# Patient Record
Sex: Female | Born: 1965 | Race: Asian | Hispanic: No | Marital: Single | Smoking: Never smoker
Health system: Southern US, Community
[De-identification: ages and names within clinical notes are randomized; demographics above are authoritative.]

## PROBLEM LIST (undated history)

## (undated) DIAGNOSIS — I214 Non-ST elevation (NSTEMI) myocardial infarction: Secondary | ICD-10-CM

## (undated) DIAGNOSIS — D649 Anemia, unspecified: Secondary | ICD-10-CM

## (undated) DIAGNOSIS — I10 Essential (primary) hypertension: Secondary | ICD-10-CM

## (undated) HISTORY — DX: Anemia, unspecified: D64.9

## (undated) HISTORY — DX: Non-ST elevation (NSTEMI) myocardial infarction: I21.4

## (undated) HISTORY — DX: Hypocalcemia: E83.51

---

## 2018-04-18 ENCOUNTER — Emergency Department (HOSPITAL_COMMUNITY): Payer: Medicaid Other

## 2018-04-18 ENCOUNTER — Encounter (HOSPITAL_COMMUNITY): Payer: Self-pay | Admitting: Emergency Medicine

## 2018-04-18 ENCOUNTER — Inpatient Hospital Stay (HOSPITAL_COMMUNITY)
Admission: EM | Admit: 2018-04-18 | Discharge: 2018-04-29 | DRG: 233 | Disposition: A | Discharge status: Home / Self Care | Payer: Medicaid Other | Attending: Cardiothoracic Surgery | Admitting: Cardiothoracic Surgery

## 2018-04-18 DIAGNOSIS — I252 Old myocardial infarction: Secondary | ICD-10-CM

## 2018-04-18 DIAGNOSIS — Z951 Presence of aortocoronary bypass graft: Secondary | ICD-10-CM

## 2018-04-18 DIAGNOSIS — F17220 Nicotine dependence, chewing tobacco, uncomplicated: Secondary | ICD-10-CM | POA: Diagnosis present

## 2018-04-18 DIAGNOSIS — Z8249 Family history of ischemic heart disease and other diseases of the circulatory system: Secondary | ICD-10-CM | POA: Diagnosis not present

## 2018-04-18 DIAGNOSIS — E669 Obesity, unspecified: Secondary | ICD-10-CM | POA: Diagnosis present

## 2018-04-18 DIAGNOSIS — Z91018 Allergy to other foods: Secondary | ICD-10-CM | POA: Diagnosis not present

## 2018-04-18 DIAGNOSIS — I959 Hypotension, unspecified: Secondary | ICD-10-CM | POA: Diagnosis present

## 2018-04-18 DIAGNOSIS — I2584 Coronary atherosclerosis due to calcified coronary lesion: Secondary | ICD-10-CM | POA: Diagnosis present

## 2018-04-18 DIAGNOSIS — D509 Iron deficiency anemia, unspecified: Secondary | ICD-10-CM | POA: Diagnosis present

## 2018-04-18 DIAGNOSIS — I214 Non-ST elevation (NSTEMI) myocardial infarction: Secondary | ICD-10-CM | POA: Diagnosis present

## 2018-04-18 DIAGNOSIS — I110 Hypertensive heart disease with heart failure: Secondary | ICD-10-CM | POA: Diagnosis not present

## 2018-04-18 DIAGNOSIS — R011 Cardiac murmur, unspecified: Secondary | ICD-10-CM | POA: Diagnosis present

## 2018-04-18 DIAGNOSIS — R7303 Prediabetes: Secondary | ICD-10-CM | POA: Diagnosis present

## 2018-04-18 DIAGNOSIS — I5021 Acute systolic (congestive) heart failure: Secondary | ICD-10-CM | POA: Diagnosis not present

## 2018-04-18 DIAGNOSIS — I2510 Atherosclerotic heart disease of native coronary artery without angina pectoris: Secondary | ICD-10-CM | POA: Diagnosis present

## 2018-04-18 DIAGNOSIS — I6522 Occlusion and stenosis of left carotid artery: Secondary | ICD-10-CM | POA: Diagnosis present

## 2018-04-18 DIAGNOSIS — X58XXXA Exposure to other specified factors, initial encounter: Secondary | ICD-10-CM | POA: Diagnosis present

## 2018-04-18 DIAGNOSIS — E785 Hyperlipidemia, unspecified: Secondary | ICD-10-CM | POA: Diagnosis present

## 2018-04-18 DIAGNOSIS — J9811 Atelectasis: Secondary | ICD-10-CM | POA: Diagnosis not present

## 2018-04-18 DIAGNOSIS — I509 Heart failure, unspecified: Secondary | ICD-10-CM | POA: Diagnosis present

## 2018-04-18 DIAGNOSIS — Z6838 Body mass index (BMI) 38.0-38.9, adult: Secondary | ICD-10-CM

## 2018-04-18 DIAGNOSIS — D62 Acute posthemorrhagic anemia: Secondary | ICD-10-CM | POA: Diagnosis not present

## 2018-04-18 DIAGNOSIS — D649 Anemia, unspecified: Secondary | ICD-10-CM

## 2018-04-18 DIAGNOSIS — M4854XA Collapsed vertebra, not elsewhere classified, thoracic region, initial encounter for fracture: Secondary | ICD-10-CM | POA: Diagnosis present

## 2018-04-18 DIAGNOSIS — Z7982 Long term (current) use of aspirin: Secondary | ICD-10-CM

## 2018-04-18 DIAGNOSIS — N920 Excessive and frequent menstruation with regular cycle: Secondary | ICD-10-CM | POA: Diagnosis present

## 2018-04-18 DIAGNOSIS — Z09 Encounter for follow-up examination after completed treatment for conditions other than malignant neoplasm: Secondary | ICD-10-CM

## 2018-04-18 DIAGNOSIS — Z79899 Other long term (current) drug therapy: Secondary | ICD-10-CM | POA: Diagnosis not present

## 2018-04-18 HISTORY — DX: Essential (primary) hypertension: I10

## 2018-04-18 LAB — BASIC METABOLIC PANEL
Anion gap: 9 (ref 5–15)
BUN: 10 mg/dL (ref 6–20)
CO2: 23 mmol/L (ref 22–32)
Calcium: 8.6 mg/dL — ABNORMAL LOW (ref 8.9–10.3)
Chloride: 102 mmol/L (ref 98–111)
Creatinine, Ser: 0.83 mg/dL (ref 0.44–1.00)
GFR calc Af Amer: >60 mL/min (ref >60)
GFR calc non Af Amer: >60 mL/min (ref >60)
Glucose, Bld: 123 mg/dL — ABNORMAL HIGH (ref 70–99)
Potassium: 3.5 mmol/L (ref 3.5–5.1)
Sodium: 134 mmol/L — ABNORMAL LOW (ref 135–145)

## 2018-04-18 LAB — RETICULOCYTES
Immature Retic Fract: 19.9 % — ABNORMAL HIGH (ref 2.3–15.9)
RBC.: 3.26 MIL/uL — ABNORMAL LOW (ref 3.87–5.11)
Retic Count, Absolute: 38.5 10*3/uL (ref 19.0–186.0)
Retic Ct Pct: 1.2 % (ref 0.4–3.1)

## 2018-04-18 LAB — CBC
HCT: 30.5 % — ABNORMAL LOW (ref 36.0–46.0)
Hemoglobin: 8 g/dL — ABNORMAL LOW (ref 12.0–15.0)
MCH: 22.2 pg — ABNORMAL LOW (ref 26.0–34.0)
MCHC: 26.2 g/dL — ABNORMAL LOW (ref 30.0–36.0)
MCV: 84.7 fL (ref 80.0–100.0)
Platelets: 306 10*3/uL (ref 150–400)
RBC: 3.6 MIL/uL — ABNORMAL LOW (ref 3.87–5.11)
RDW: 15 % (ref 11.5–15.5)
WBC: 5.1 10*3/uL (ref 4.0–10.5)
nRBC: 0 % (ref 0.0–0.2)

## 2018-04-18 LAB — I-STAT TROPONIN, ED: Troponin i, poc: 0.16 ng/mL (ref 0.00–0.08)

## 2018-04-18 LAB — BRAIN NATRIURETIC PEPTIDE: B Natriuretic Peptide: 287.6 pg/mL — ABNORMAL HIGH (ref 0.0–100.0)

## 2018-04-18 LAB — I-STAT BETA HCG BLOOD, ED (MC, WL, AP ONLY): I-stat hCG, quantitative: <5 m[IU]/mL (ref <5)

## 2018-04-18 LAB — HEMOGLOBIN A1C
HEMOGLOBIN A1C: 5.9 % — AB (ref 4.8–5.6)
MEAN PLASMA GLUCOSE: 122.63 mg/dL

## 2018-04-18 MED ORDER — ASPIRIN 81 MG PO CHEW
81.0000 mg | CHEWABLE_TABLET | Freq: Every day | ORAL | Status: DC
  Administered 2018-04-19 – 2018-04-20 (×2): 81 mg via ORAL
  Filled 2018-04-18 (×2): qty 1

## 2018-04-18 MED ORDER — SODIUM CHLORIDE 0.9 % IV BOLUS
1000.0000 mL | Freq: Once | INTRAVENOUS | Status: AC
  Administered 2018-04-18: 1000 mL via INTRAVENOUS

## 2018-04-18 MED ORDER — NITROGLYCERIN 0.4 MG SL SUBL
0.4000 mg | SUBLINGUAL_TABLET | SUBLINGUAL | Status: DC | PRN
  Administered 2018-04-22 – 2018-04-23 (×2): 0.4 mg via SUBLINGUAL
  Filled 2018-04-18 (×6): qty 1

## 2018-04-18 MED ORDER — ACETAMINOPHEN 325 MG PO TABS
650.0000 mg | ORAL_TABLET | ORAL | Status: DC | PRN
  Administered 2018-04-20 – 2018-04-21 (×2): 650 mg via ORAL
  Filled 2018-04-18 (×3): qty 2

## 2018-04-18 MED ORDER — HEPARIN (PORCINE) IN NACL 100-0.45 UNIT/ML-% IJ SOLN
850.0000 [IU]/h | INTRAMUSCULAR | Status: DC
  Administered 2018-04-18: 600 [IU]/h via INTRAVENOUS
  Administered 2018-04-19 – 2018-04-21 (×2): 850 [IU]/h via INTRAVENOUS
  Filled 2018-04-18 (×3): qty 250

## 2018-04-18 MED ORDER — HEPARIN BOLUS VIA INFUSION
3000.0000 [IU] | Freq: Once | INTRAVENOUS | Status: AC
  Administered 2018-04-18: 3000 [IU] via INTRAVENOUS
  Filled 2018-04-18: qty 3000

## 2018-04-18 MED ORDER — ATORVASTATIN CALCIUM 40 MG PO TABS
40.0000 mg | ORAL_TABLET | Freq: Every day | ORAL | Status: DC
  Administered 2018-04-19 – 2018-04-28 (×9): 40 mg via ORAL
  Filled 2018-04-18 (×9): qty 1

## 2018-04-18 MED ORDER — ASPIRIN 81 MG PO CHEW: 324.0000 mg | CHEWABLE_TABLET | ORAL | Status: AC

## 2018-04-18 MED ORDER — ASPIRIN 300 MG RE SUPP: 300.0000 mg | RECTAL | Status: AC

## 2018-04-18 MED ORDER — MORPHINE SULFATE (PF) 4 MG/ML IV SOLN
4.0000 mg | Freq: Once | INTRAVENOUS | Status: AC
  Administered 2018-04-18: 4 mg via INTRAVENOUS
  Filled 2018-04-18: qty 1

## 2018-04-18 NOTE — H&P (Addendum)
Cardiology Admission History and Physical:   Patient ID: Abeeha Twist MRN: [id number]; DOB: 1965-08-13   Admission date: 04/18/2018  Primary Care Provider: No primary care provider on file. Primary Cardiologist: No primary care provider on file.    Chief Complaint:  Chest pain  Patient Profile:   [name] is a 52 y.o. female with a history of HF, HTN and anemia who presents to the ED with chest pain.   History of Present Illness:   [name] is a 52 y.o. female with a history of HF, HTN and anemia who presents to the ED with chest pain.   The patient has relocated to [facility] from [place]. She reports a history of HF and HTN for which she takes carvedilol and lisinopril and aspirin; she was previously on other medications but stopped taking them due to cost. She has had prior echocardiograms and believes that she has had prior stress tests (results unknown), but she does not think she has had prior cardiac catheterizations.  She presented to the ED today with chest pain has occurred intermittently over the past two weeks. She describes the pain to be a sharp sensation that comes without specific activity. This afternoon she ate dinner and had persistent symptoms for three hours. She denies chest pain before two weeks ago and denies associated dyspnea or edema or any other symptoms. Given her ongoing pain today, she came to the ED.  In the ED, pt with SBP 90-100s and HR in the 70s. ECG showed sinus rhythm with LVH and ST depressions and TWI in inferolateral leads. Labs notable for troponin 0.16, Hgb 8.0 (baseline unknown). She was given NTG and morphine with improvement of her pain. She was given 1L NS bolus for low BP. Cardiology was called for further recommendations.   On my evaluation, the patient reports resolution of her chest pain, although she does report a minimal heavy sensation. She states that she has had low blood counts in the past requiring blood transfusions. She is unaware  of the cause of her anemia. She states that she is at the end of her menstrual cycle, which was very heavy.   Past Medical History:  Diagnosis Date  . Hypertension     Past Surgical History:  Procedure Laterality Date  . CESAREAN SECTION       Medications Prior to Admission: Prior to Admission medications   Not on File     Allergies:   Not on File  Social History:   Social History   Socioeconomic History  . Marital status: Married    Spouse name: Not on file  . Number of children: Not on file  . Years of education: Not on file  . Highest education level: Not on file  Occupational History  . Not on file  Social Needs  . Financial resource strain: Not on file  . Food insecurity:    Worry: Not on file    Inability: Not on file  . Transportation needs:    Medical: Not on file    Non-medical: Not on file  Tobacco Use  . Smoking status: Never Smoker  . Smokeless tobacco: Current User    Types: Chew  Substance and Sexual Activity  . Alcohol use: Not Currently    Frequency: Never  . Drug use: Not on file  . Sexual activity: Not on file  Lifestyle  . Physical activity:    Days per week: Not on file    Minutes per session: Not on file  .  Stress: Not on file  Relationships  . Social connections:    Talks on phone: Not on file    Gets together: Not on file    Attends religious service: Not on file    Active member of club or organization: Not on file    Attends meetings of clubs or organizations: Not on file    Relationship status: Not on file  . Intimate partner violence:    Fear of current or ex partner: Not on file    Emotionally abused: Not on file    Physically abused: Not on file    Forced sexual activity: Not on file  Other Topics Concern  . Not on file  Social History Narrative  . Not on file    Family History:   The patient's family history includes Hypertension in her father.    ROS:  Please see the history of present illness.  All other ROS  reviewed and negative.     Physical Exam/Data:   Vitals:   04/18/18 1822 04/18/18 1937 04/18/18 2000  BP: (!) 103/58 (!) 95/55   Pulse: 72 71   Resp:  16   Temp: 98.5 F (36.9 C) 98.4 F (36.9 C)   TempSrc: Oral Oral   SpO2: 100% 100%   Weight:   72.6 kg  Height:   4\' 6"  (1.372 m)   No intake or output data in the 24 hours ending 04/18/18 2119 Filed Weights   04/18/18 2000  Weight: 72.6 kg   Body mass index is 38.58 kg/m.  General:  Well nourished, well developed, in no acute distress HEENT: normal Lymph: no adenopathy Neck: JVD 3 cm above clavicle at 45 degrees. +HSM Endocrine:  No thryomegaly Cardiac:  normal S1, S2; RRR; II/VI systolic murmur Lungs:  clear to auscultation bilaterally, no wheezing, rhonchi or rales  Abd: soft, nontender  Ext: no LE edema Musculoskeletal:  No deformities, BUE and BLE strength normal and equal Skin: warm and dry  Neuro:  CNs 2-12 intact, no focal abnormalities noted Psych:  Normal affect    EKG:  The ECG that was done and was personally reviewed and is described above in HPI  Relevant CV Studies: None in system   Laboratory Data:  Chemistry Recent Labs  Lab 04/18/18 1839  NA 134*  K 3.5  CL 102  CO2 23  GLUCOSE 123*  BUN 10  CREATININE 0.83  CALCIUM 8.6*  GFRNONAA >60  GFRAA >60  ANIONGAP 9    No results for input(s): PROT, ALBUMIN, AST, ALT, ALKPHOS, BILITOT in the last 168 hours. Hematology Recent Labs  Lab 04/18/18 1839  WBC 5.1  RBC 3.60*  HGB 8.0*  HCT 30.5*  MCV 84.7  MCH 22.2*  MCHC 26.2*  RDW 15.0  PLT 306   Cardiac EnzymesNo results for input(s): TROPONINI in the last 168 hours.  Recent Labs  Lab 04/18/18 1850  TROPIPOC 0.16*    BNPNo results for input(s): BNP, PROBNP in the last 168 hours.  DDimer No results for input(s): DDIMER in the last 168 hours.  Radiology/Studies:  Dg Chest 2 View  Result Date: 04/18/2018 CLINICAL DATA:  Pt states having medial chest pains for 2 weeks got  severe today with SOB> Hx of HTN EXAM: CHEST - 2 VIEW COMPARISON:  None. FINDINGS: Heart size is normal. The lungs are free of focal consolidations and pleural effusions. No pulmonary edema. There is mild anterior wedge deformity of midthoracic vertebral bodies. IMPRESSION: 1.  No evidence for  acute cardiopulmonary abnormality. 2. Midthoracic wedge compression fractures are probably chronic and warrant further evaluation. Recommend DXA scan if not performed recently. Electronically Signed   By: Norva Pavlov M.D.   On: 04/18/2018 19:04    Assessment and Plan:   Chest pain Suspected NSTEMI The patient presents to the ED with two weeks of intermittent chest pain that was persistent today. ECG shows LVH and repolarization changes that may be consistent with ischemia. Troponin is mildly elevated on initial measurement. She has a reported history of unspecified HF and HTN; her ischemic workup history is unclear. Her described symptoms are atypical, but her clinical presentation is nevertheless concerning for ACS. She may also be experiencing a contribution of symptomatic anemia. She is currently chest pain free after NTG and morphine. At this time, will plan for empiric treatment for ACS. Will need caution with antithrombotic therapy in the setting of concomitant anemia.  -Heparin infusion ordered.  -Continue ASA -Atorvastatin ordered -NTG PRN pain -Echocardiogram ordered -Continue to trend troponin -Holding home BB until BP stability established -HgA1c, lipid panel ordered -NPO at midnight in anticipation of possible LHC in AM  Anemia Hgb 8.0 in the ED; MCV consistent with normocytic anemia. She reports a history of anemia of unclear etiology for which she has had transfusions. She is now finishing a heavy menstrual cycle, which may be the cause of her anemia. As above, this anemia may be contributing to presenting symptoms. -Continue to trend.  -Type and screen ordered. Will transfuse if Hgb  drops <8 given suspected ACS -Iron panel, reticulocytes added on to labs. Can work up further as needed.  HF The patient reports a history of HF with details that are unclear. She is on medications that suggest reduced EF. She has not had any symptoms of HF and only complains of recent chest pain. She has mild JVP elevation but does not have signs of significant hypervolemia. -Echocardiogram ordered -BNP ordered -Holding home carvedilolol, lisinopril in the setting of relative hypotension  -Hold further fluids for now. May need diuresis if she undergoes transfusion.   Systolic murmur Noted on exam. Sounds to be related to AoV outflow. Unclear if this may be contributing to symptoms. -Echocardiogram ordered  HTN BP relatively low in the ED for unclear reasons. She was given IVF in the ED -Holding BP modifying medications for now -Holding IVF given HF and mild hypervolemia  Wedge compression Noted on ED CXR -Will need DEXA  Severity of Illness: The appropriate patient status for this patient is INPATIENT. Inpatient status is judged to be reasonable and necessary in order to provide the required intensity of service to ensure the patient's safety. The patient's presenting symptoms, physical exam findings, and initial radiographic and laboratory data in the context of their chronic comorbidities is felt to place them at high risk for further clinical deterioration. Furthermore, it is not anticipated that the patient will be medically stable for discharge from the hospital within 2 midnights of admission. The following factors support the patient status of inpatient.   " The patient's presenting symptoms include chest pain. " The worrisome physical exam findings include elevated JVP. " The initial radiographic and laboratory data are worrisome because of elevated troponin. " The chronic co-morbidities include HF, HTN, anemia.   * I certify that at the point of admission it is my clinical  judgment that the patient will require inpatient hospital care spanning beyond 2 midnights from the point of admission due to high intensity of service,  high risk for further deterioration and high frequency of surveillance required.*    For questions or updates, please contact [facility] Please consult [email or web address] for contact info under        Signed, Ernest Mallick, MD  04/18/2018 9:19 PM

## 2018-04-18 NOTE — Progress Notes (Signed)
ANTICOAGULATION CONSULT NOTE - Initial Consult  Pharmacy Consult for heparin Indication: chest pain/ACS  Not on File  Patient Measurements: Height: 4\' 6"  (137.2 cm)(per patient) Weight: 160 lb (72.6 kg)(per patient) IBW/kg (Calculated) : 31.7 Heparin Dosing Weight: 49.5 kg  Vital Signs: Temp: 98.4 F (36.9 C) (10/25 1937) Temp Source: Oral (10/25 1937) BP: 95/55 (10/25 1937) Pulse Rate: 71 (10/25 1937)  Labs: Recent Labs    04/18/18 1839  HGB 8.0*  HCT 30.5*  PLT 306  CREATININE 0.83    Estimated Creatinine Clearance: 61.6 mL/min (by C-G formula based on SCr of 0.83 mg/dL).   Medical History: Past Medical History:  Diagnosis Date  . Hypertension     Medications:  Scheduled:  . heparin  3,000 Units Intravenous Once  .  morphine injection  4 mg Intravenous Once   Infusions:  . heparin    . sodium chloride      Assessment: Pt is a 52 YOF presenting with sharp chest pain, worsening throughout the day. Spoke with patient to ask for height and weight; pt also noted that she does not take any anticoagulants. Pt states that she only takes lisinopril at home. Hemoglobin is 8.0, will monitor closely for any changes. Pharmacy has been consulted for heparin dosing for ACS/chest pain. Hgb - 8.0, Plt - 306  Goal of Therapy:  Heparin level 0.3-0.7 units/ml Monitor platelets by anticoagulation protocol: Yes   Plan:  Heparin 3000 unit bolus x1, then heparin gtt 600 units/hr 6-hour HL at 0400 Daily HL and CBC With Hgb of 8.0, will monitor closely for any changes  Koleen Nimrod 04/18/2018,8:56 PM

## 2018-04-18 NOTE — ED Provider Notes (Addendum)
[facility] EMERGENCY DEPARTMENT Provider Note   CSN: [id number] Arrival date & time: 04/18/18  1812     History   Chief Complaint Chief Complaint  Patient presents with  . Chest Pain    HPI [name] is a 52 y.o. female.  Patient complains of chest discomfort this been going on and off for couple weeks.  She also started having chest pain at 4 PM today that has been continuous.  Patient has a history of hypertension  The history is provided by the patient. No language interpreter was used.  Chest Pain   This is a new problem. The current episode started 6 to 12 hours ago. The problem occurs constantly. The problem has not changed since onset.The pain is associated with exertion. The pain is present in the substernal region. The pain is at a severity of 6/10. The pain is moderate. The quality of the pain is described as dull. The pain does not radiate. Exacerbated by: unknown. Pertinent negatives include no abdominal pain, no back pain, no cough and no headaches.  Pertinent negatives for past medical history include no seizures.    Past Medical History:  Diagnosis Date  . Hypertension     There are no active problems to display for this patient.   Past Surgical History:  Procedure Laterality Date  . CESAREAN SECTION       OB History   None      Home Medications    Prior to Admission medications   Not on File    Family History History reviewed. No pertinent family history.  Social History Social History   Tobacco Use  . Smoking status: Never Smoker  . Smokeless tobacco: Current User    Types: Chew  Substance Use Topics  . Alcohol use: Not Currently    Frequency: Never  . Drug use: Not on file     Allergies   Patient has no allergy information on record.   Review of Systems Review of Systems  Constitutional: Negative for appetite change and fatigue.  HENT: Negative for congestion, ear discharge and sinus pressure.     Eyes: Negative for discharge.  Respiratory: Negative for cough.   Cardiovascular: Positive for chest pain.  Gastrointestinal: Negative for abdominal pain and diarrhea.  Genitourinary: Negative for frequency and hematuria.  Musculoskeletal: Negative for back pain.  Skin: Negative for rash.  Neurological: Negative for seizures and headaches.  Psychiatric/Behavioral: Negative for hallucinations.     Physical Exam Updated Vital Signs BP (!) 95/55 (BP Location: Right Arm)   Pulse 71   Temp 98.4 F (36.9 C) (Oral)   Resp 16   SpO2 100%   Physical Exam  Constitutional: She is oriented to person, place, and time. She appears well-developed.  HENT:  Head: Normocephalic.  Eyes: Conjunctivae and EOM are normal. No scleral icterus.  Neck: Neck supple. No thyromegaly present.  Cardiovascular: Normal rate and regular rhythm. Exam reveals no gallop and no friction rub.  No murmur heard. Pulmonary/Chest: No stridor. She has no wheezes. She has no rales. She exhibits no tenderness.  Abdominal: She exhibits no distension. There is no tenderness. There is no rebound.  Musculoskeletal: Normal range of motion. She exhibits no edema.  Lymphadenopathy:    She has no cervical adenopathy.  Neurological: She is oriented to person, place, and time. She exhibits normal muscle tone. Coordination normal.  Skin: No rash noted. No erythema.  Psychiatric: She has a normal mood and affect. Her behavior is  normal.     ED Treatments / Results  Labs (all labs ordered are listed, but only abnormal results are displayed) Labs Reviewed  BASIC METABOLIC PANEL - Abnormal; Notable for the following components:      Result Value   Sodium 134 (*)    Glucose, Bld 123 (*)    Calcium 8.6 (*)    All other components within normal limits  CBC - Abnormal; Notable for the following components:   RBC 3.60 (*)    Hemoglobin 8.0 (*)    HCT 30.5 (*)    MCH 22.2 (*)    MCHC 26.2 (*)    All other components within  normal limits  I-STAT TROPONIN, ED - Abnormal; Notable for the following components:   Troponin i, poc 0.16 (*)    All other components within normal limits  TROPONIN I  BRAIN NATRIURETIC PEPTIDE  I-STAT BETA HCG BLOOD, ED ([facility], [facility], [facility] ONLY)    EKG None  Radiology Dg Chest 2 View  Result Date: 04/18/2018 CLINICAL DATA:  Pt states having medial chest pains for 2 weeks got severe today with SOB> Hx of HTN EXAM: CHEST - 2 VIEW COMPARISON:  None. FINDINGS: Heart size is normal. The lungs are free of focal consolidations and pleural effusions. No pulmonary edema. There is mild anterior wedge deformity of midthoracic vertebral bodies. IMPRESSION: 1.  No evidence for acute cardiopulmonary abnormality. 2. Midthoracic wedge compression fractures are probably chronic and warrant further evaluation. Recommend DXA scan if not performed recently. Electronically Signed   By: Norva Pavlov M.D.   On: 04/18/2018 19:04    Procedures Procedures (including critical care time)  Medications Ordered in ED Medications  sodium chloride 0.9 % bolus 1,000 mL (has no administration in time range)  nitroGLYCERIN (NITROSTAT) SL tablet 0.4 mg (has no administration in time range)  morphine 4 MG/ML injection 4 mg (has no administration in time range)  heparin bolus via infusion 4,000 Units (has no administration in time range)  heparin ADULT infusion 100 units/mL (25000 units/231mL sodium chloride 0.45%) (has no administration in time range)   CRITICAL CARE Performed by: Bethann Berkshire Total critical care time: 40 minutes Critical care time was exclusive of separately billable procedures and treating other patients. Critical care was necessary to treat or prevent imminent or life-threatening deterioration. Critical care was time spent personally by me on the following activities: development of treatment plan with patient and/or surrogate as well as nursing, discussions with consultants, evaluation of patient's  response to treatment, examination of patient, obtaining history from patient or surrogate, ordering and performing treatments and interventions, ordering and review of laboratory studies, ordering and review of radiographic studies, pulse oximetry and re-evaluation of patient's condition.   Initial Impression / Assessment and Plan / ED Course  I have reviewed the triage vital signs and the nursing notes.  Pertinent labs & imaging results that were available during my care of the patient were reviewed by me and considered in my medical decision making (see chart for details).   Patient with chest discomfort and elevated troponin.  Patient has an N STEMI.  Patient will be admitted by cardiology.  Patient also has anemia    Final Clinical Impressions(s) / ED Diagnoses   Final diagnoses:  NSTEMI (non-ST elevated myocardial infarction) [facility])    ED Discharge Orders    None       Bethann Berkshire, MD 04/18/18 1610    Bethann Berkshire, MD 04/29/18 1301

## 2018-04-18 NOTE — ED Triage Notes (Signed)
Per [facility]:  Patient presents for midsternal chest pain, sharp in nature, worsening through-out the day.  Pt rated 8/10 on EMS arrival.  Pt states shes had a similar pain on and off for two weeks.  Given 324 ASA and 1 nitro en route, reduction of pain to 3/10 pressure now.  22 RH

## 2018-04-18 NOTE — ED Provider Notes (Signed)
Patient placed in Quick Look pathway, seen and evaluated   Chief Complaint: chest pain  HPI: [name] is a 52 y.o. female who presents to the ED via EMS with chest pain that she describes as sharp in the midsternum and has gotten worse throughout the day. Patient rated her pain as 8/10 to EMS. Patient reports similar pain off and on for 2 weeks. Patient given 324 mg ASA and 1 nitro en route to the ED and patient reports now pain is 3/10.   ROS: CV: chest pain  Physical Exam:  BP (!) 103/58 (BP Location: Right Arm)   Pulse 72   Temp 98.5 F (36.9 C) (Oral)   SpO2 100%    Gen: No distress  Neuro: Awake and Alert  Skin: Warm and dry  Heart: regular rate and rhythm  Lungs: clear   Initiation of care has begun. The patient has been counseled on the process, plan, and necessity for staying for the completion/evaluation, and the remainder of the medical screening examination    Janne Napoleon, NP 04/18/18 Berna Spare    Raeford Razor, MD 04/19/18 1718

## 2018-04-19 LAB — CBC
HEMATOCRIT: 27.7 % — AB (ref 36.0–46.0)
HEMOGLOBIN: 7.4 g/dL — AB (ref 12.0–15.0)
MCH: 22.5 pg — ABNORMAL LOW (ref 26.0–34.0)
MCHC: 26.7 g/dL — ABNORMAL LOW (ref 30.0–36.0)
MCV: 84.2 fL (ref 80.0–100.0)
Platelets: 287 10*3/uL (ref 150–400)
RBC: 3.29 MIL/uL — ABNORMAL LOW (ref 3.87–5.11)
RDW: 15.2 % (ref 11.5–15.5)
WBC: 6.2 10*3/uL (ref 4.0–10.5)
nRBC: 0 % (ref 0.0–0.2)

## 2018-04-19 LAB — ABO/RH: ABO/RH(D): A POS

## 2018-04-19 LAB — BASIC METABOLIC PANEL
Anion gap: 6 (ref 5–15)
BUN: 8 mg/dL (ref 6–20)
CHLORIDE: 110 mmol/L (ref 98–111)
CO2: 22 mmol/L (ref 22–32)
CREATININE: 0.81 mg/dL (ref 0.44–1.00)
Calcium: 8.1 mg/dL — ABNORMAL LOW (ref 8.9–10.3)
GFR calc non Af Amer: >60 mL/min (ref >60)
Glucose, Bld: 84 mg/dL (ref 70–99)
POTASSIUM: 4.1 mmol/L (ref 3.5–5.1)
SODIUM: 138 mmol/L (ref 135–145)

## 2018-04-19 LAB — LIPID PANEL
CHOL/HDL RATIO: 3.8 ratio
CHOLESTEROL: 194 mg/dL (ref 0–200)
HDL: 51 mg/dL (ref >40)
LDL Cholesterol: 132 mg/dL — ABNORMAL HIGH (ref 0–99)
TRIGLYCERIDES: 57 mg/dL (ref <150)
VLDL: 11 mg/dL (ref 0–40)

## 2018-04-19 LAB — HEMOGLOBIN AND HEMATOCRIT, BLOOD
HCT: 32 % — ABNORMAL LOW (ref 36.0–46.0)
Hemoglobin: 9.1 g/dL — ABNORMAL LOW (ref 12.0–15.0)

## 2018-04-19 LAB — PREPARE RBC (CROSSMATCH)

## 2018-04-19 LAB — TROPONIN I
TROPONIN I: 0.61 ng/mL — AB (ref <0.03)
Troponin I: 0.38 ng/mL (ref <0.03)

## 2018-04-19 LAB — FERRITIN: FERRITIN: 5 ng/mL — AB (ref 11–307)

## 2018-04-19 LAB — IRON AND TIBC
Iron: 14 ug/dL — ABNORMAL LOW (ref 28–170)
SATURATION RATIOS: 4 % — AB (ref 10.4–31.8)
TIBC: 364 ug/dL (ref 250–450)
UIBC: 350 ug/dL

## 2018-04-19 LAB — HEPARIN LEVEL (UNFRACTIONATED)
HEPARIN UNFRACTIONATED: 0.51 [IU]/mL (ref 0.30–0.70)
Heparin Unfractionated: 0.11 IU/mL — ABNORMAL LOW (ref 0.30–0.70)

## 2018-04-19 LAB — HIV ANTIBODY (ROUTINE TESTING W REFLEX): HIV Screen 4th Generation wRfx: NONREACTIVE

## 2018-04-19 MED ORDER — SODIUM CHLORIDE 0.9 % IV SOLN
510.0000 mg | Freq: Once | INTRAVENOUS | Status: AC
  Administered 2018-04-19: 510 mg via INTRAVENOUS
  Filled 2018-04-19: qty 17

## 2018-04-19 MED ORDER — METOPROLOL TARTRATE 12.5 MG HALF TABLET
12.5000 mg | ORAL_TABLET | Freq: Two times a day (BID) | ORAL | Status: DC
  Administered 2018-04-19 – 2018-04-23 (×10): 12.5 mg via ORAL
  Filled 2018-04-19 (×10): qty 1

## 2018-04-19 MED ORDER — NITROGLYCERIN 2 % TD OINT
1.0000 [in_us] | TOPICAL_OINTMENT | Freq: Four times a day (QID) | TRANSDERMAL | Status: DC
  Administered 2018-04-19 – 2018-04-21 (×11): 1 [in_us] via TOPICAL
  Filled 2018-04-19 (×2): qty 1
  Filled 2018-04-19: qty 30
  Filled 2018-04-19: qty 1

## 2018-04-19 MED ORDER — FUROSEMIDE 10 MG/ML IJ SOLN
40.0000 mg | Freq: Once | INTRAMUSCULAR | Status: AC
  Administered 2018-04-19: 40 mg via INTRAVENOUS
  Filled 2018-04-19: qty 4

## 2018-04-19 MED ORDER — SODIUM CHLORIDE 0.9% IV SOLUTION
Freq: Once | INTRAVENOUS | Status: AC
  Administered 2018-04-19: 08:00:00 via INTRAVENOUS

## 2018-04-19 MED ORDER — HEPARIN BOLUS VIA INFUSION
1500.0000 [IU] | Freq: Once | INTRAVENOUS | Status: AC
  Administered 2018-04-19: 1500 [IU] via INTRAVENOUS
  Filled 2018-04-19: qty 1500

## 2018-04-19 NOTE — ED Notes (Signed)
Patient assisted to use bedpan.

## 2018-04-19 NOTE — ED Notes (Signed)
Pt states discomfort now down to 3/10, pt states pain is worse after she moves, pt previously had been placed on bedpan

## 2018-04-19 NOTE — ED Notes (Signed)
BB called, blood products ready for patient

## 2018-04-19 NOTE — Progress Notes (Signed)
Progress Note  Patient Name: [name] Date of Encounter: 04/19/2018  Primary Cardiologist: New  Subjective   [name] is a 52 y.o. female originally from [place] with a history of HF, severe HTN and anemia who was admitted from ED on 10/25 with NSTEMI and anemia.   She tells me that she was in [place] in 2013 and had a syncopal episode. Went to the hospital and told that her heart was weak because her BP was very high. Did not have a cath at that time. Says she moved to [place] about 6 months ago from [place] but won't give me more details.   Admitted with several weeks of CP. Troponin 0.38 -> 0.61. With LVH and nonspecific ECG abnormalities. Has a h/o heavy menstrual periods which is chronic for her.  Hgb was 7.4 on admit and received 1u RBC -> 9.1  Now CP. No orthopnea or PND.   Inpatient Medications    Scheduled Meds: . aspirin  324 mg Oral NOW   Or  . aspirin  300 mg Rectal NOW  . aspirin  81 mg Oral Daily  . atorvastatin  40 mg Oral q1800  . metoprolol tartrate  12.5 mg Oral BID  . nitroGLYCERIN  1 inch Topical Q6H   Continuous Infusions: . heparin 850 Units/hr (04/19/18 0600)   PRN Meds: acetaminophen, nitroGLYCERIN   Vital Signs    Vitals:   04/19/18 1230 04/19/18 1300 04/19/18 1357 04/19/18 1821  BP: (!) 110/58 116/65 116/67 121/65  Pulse: 71 73 64 72  Resp: 14 15  16   Temp:   98.5 F (36.9 C) 98.6 F (37 C)  TempSrc:   Oral Oral  SpO2: 99% 100% 98% 98%  Weight:      Height:        Intake/Output Summary (Last 24 hours) at 04/19/2018 1844 Last data filed at 04/19/2018 1005 Gross per 24 hour  Intake 1365 ml  Output 900 ml  Net 465 ml   Filed Weights   04/18/18 2000  Weight: 72.6 kg    Telemetry    NSR 60-70s Personally reviewed   ECG    NSR 67 LVH with lateral ST scooping. Inferior TWI. No acute. .Personally reviewed   Physical Exam   General:  Well appearing. No resp difficulty HEENT: normal Neck: supple. no JVD. Carotids 2+  bilat; no bruits. No lymphadenopathy or thryomegaly appreciated. Cor: PMI nondisplaced. Regular rate & rhythm. No rubs, gallops or murmurs. Lungs: clear Abdomen: obese soft, nontender, nondistended. No hepatosplenomegaly. No bruits or masses. Good bowel sounds. Extremities: no cyanosis, clubbing, rash, edema Neuro: alert & orientedx3, cranial nerves grossly intact. moves all 4 extremities w/o difficulty. Affect pleasant   Labs    Chemistry Recent Labs  Lab 04/18/18 1839 04/19/18 0406  NA 134* 138  K 3.5 4.1  CL 102 110  CO2 23 22  GLUCOSE 123* 84  BUN 10 8  CREATININE 0.83 0.81  CALCIUM 8.6* 8.1*  GFRNONAA >60 >60  GFRAA >60 >60  ANIONGAP 9 6     Hematology Recent Labs  Lab 04/18/18 1839 04/18/18 2224 04/19/18 0406 04/19/18 1147  WBC 5.1  --  6.2  --   RBC 3.60* 3.26* 3.29*  --   HGB 8.0*  --  7.4* 9.1*  HCT 30.5*  --  27.7* 32.0*  MCV 84.7  --  84.2  --   MCH 22.2*  --  22.5*  --   MCHC 26.2*  --  26.7*  --  RDW 15.0  --  15.2  --   PLT 306  --  287  --     Cardiac Enzymes Recent Labs  Lab 04/18/18 2224 04/19/18 0631  TROPONINI 0.38* 0.61*    Recent Labs  Lab 04/18/18 1850  TROPIPOC 0.16*     BNP Recent Labs  Lab 04/18/18 2224  BNP 287.6*     DDimer No results for input(s): DDIMER in the last 168 hours.   Radiology    Dg Chest 2 View  Result Date: 04/18/2018 CLINICAL DATA:  Pt states having medial chest pains for 2 weeks got severe today with SOB> Hx of HTN EXAM: CHEST - 2 VIEW COMPARISON:  None. FINDINGS: Heart size is normal. The lungs are free of focal consolidations and pleural effusions. No pulmonary edema. There is mild anterior wedge deformity of midthoracic vertebral bodies. IMPRESSION: 1.  No evidence for acute cardiopulmonary abnormality. 2. Midthoracic wedge compression fractures are probably chronic and warrant further evaluation. Recommend DXA scan if not performed recently. Electronically Signed   By: Norva Pavlov M.D.   On:  04/18/2018 19:04    Cardiac Studies   Pending   Patient Profile     [name] is a 52 y.o. female originally from [place] with a history of HF, severe HTN and anemia who was admitted from ED on 10/25 with NSTEMI and anemia  Assessment & Plan    1) Chest pain/ NSTEMI - Troponin mildly elevated 0.38->0.61 c/w NSTEMI likely precipitated by severe anemia from heavy menses - now CP free - ECG with LVH and nonspecific abnormalities  - I did bedside echo and EF ~ 50-55% with probable inferior/posterior HK  - hemoglobin stable on heparin without overt bleeding,  - Continue ASA, statin and low-dose b-blocker - Formal echo pending - Discussed cath and she is ok to proceed.   2) Anemia, iron-deficiency - Hgb 7.4 in the ED; now 9.1 after 1u transfusion  - She has h/o heavy menses but no other bleeding/ Iron panel confirms severe IDA  - will give Feraheme - will need GYN f/u at some point  3) h/o  HF - The patient reports a history of HF which suggests previous hypertensive CM/ She is on medications that suggest reduced EF.  - No current HF symptoms. BNP mildly elevated at 287  - I did bedside echo and EF ~ 50-55% with probable inferior/posterior HK  - Formal echo pending - Holding home lisinopril in the setting of relative hypotension. Can restart after cath   5) HTN - Reports severe HTN in past.  BP relatively low in the ED for unclear reasons. Now improved with IVF  6) Wedge compression -Noted on ED CXR -Will need outpatient f/u   For questions or updates, please contact [facility] Please consult [email or web address] for contact info under        Signed, Arvilla Meres, MD  04/19/2018, 6:44 PM

## 2018-04-19 NOTE — Progress Notes (Signed)
Pt admitted to unit, oriented to unit and room.  Vital signs stable, CCMD called, call bell within reach and bed in lowest position.

## 2018-04-19 NOTE — ED Provider Notes (Signed)
Blood pressure 132/73, pulse 72, temperature 98.6 F (37 C), temperature source Oral, resp. rate (!) 22, height 4\' 6"  (1.372 m), weight 72.6 kg, SpO2 100 %.   In short, [name] is a 52 y.o. female with a chief complaint of Chest Pain .  Refer to the original H&P for additional details.  Called to bedside for worsening chest pain and diffuse ST depressions on EKG. Patient admitted to the Cardiology service. CP resolved and ST depressions resolved as well. Discussed with the Cardiologist on call who reviewed the EKG. Repeat troponin is pending. They will round this AM. No further intervention.      Maia Plan, MD 04/19/18 1011

## 2018-04-19 NOTE — Progress Notes (Signed)
ANTICOAGULATION CONSULT NOTE  Pharmacy Consult for heparin Indication: chest pain/ACS  No Known Allergies  Patient Measurements: Height: 4\' 6"  (137.2 cm)(per patient) Weight: 160 lb (72.6 kg)(per patient) IBW/kg (Calculated) : 31.7 Heparin Dosing Weight: 49.5 kg  Vital Signs: Temp: 98.5 F (36.9 C) (10/26 1357) Temp Source: Oral (10/26 1357) BP: 116/67 (10/26 1357) Pulse Rate: 64 (10/26 1357)  Labs: Recent Labs    04/18/18 1839 04/18/18 2224 04/19/18 0406 04/19/18 0631 04/19/18 1147  HGB 8.0*  --  7.4*  --  9.1*  HCT 30.5*  --  27.7*  --  32.0*  PLT 306  --  287  --   --   HEPARINUNFRC  --   --  0.11*  --  0.51  CREATININE 0.83  --  0.81  --   --   TROPONINI  --  0.38*  --  0.61*  --     Estimated Creatinine Clearance: 63.1 mL/min (by C-G formula based on SCr of 0.81 mg/dL).   Medical History: Past Medical History:  Diagnosis Date  . Hypertension     Medications:  Scheduled:  . aspirin  324 mg Oral NOW   Or  . aspirin  300 mg Rectal NOW  . aspirin  81 mg Oral Daily  . atorvastatin  40 mg Oral q1800  . metoprolol tartrate  12.5 mg Oral BID  . nitroGLYCERIN  1 inch Topical Q6H   Infusions:  . heparin 850 Units/hr (04/19/18 0600)    Assessment: Pt is a 52 YOF presenting with sharp chest pain, worsening throughout the day. Not on anticoagulation PTA. Pharmacy has been consulted for heparin dosing for ACS/chest pain. Heparin level now therapeutic at 0.51. Hg improved to 9.1 s/p transfusion 10/26. Plt wnl. No bleed issues documented.  Goal of Therapy:  Heparin level 0.3-0.7 units/ml Monitor platelets by anticoagulation protocol: Yes   Plan:  Continue heparin gtt at 600 units/hr Monitor daily heparin level and CBC, s/sx bleeding F/u Cardiology plans  Babs Bertin, PharmD, BCPS Clinical Pharmacist Clinical phone [phone] Please check AMION for all [facility] contact numbers 04/19/2018 2:52 PM

## 2018-04-19 NOTE — ED Notes (Signed)
Heparin bolus given, gtt adjusted per protocol

## 2018-04-19 NOTE — Progress Notes (Signed)
ANTICOAGULATION CONSULT NOTE  Pharmacy Consult for heparin Indication: chest pain/ACS  No Known Allergies  Patient Measurements: Height: 4\' 6"  (137.2 cm)(per patient) Weight: 160 lb (72.6 kg)(per patient) IBW/kg (Calculated) : 31.7 Heparin Dosing Weight: 49.5 kg  Vital Signs: Temp: 98.4 F (36.9 C) (10/25 1937) Temp Source: Oral (10/25 1937) BP: 116/65 (10/26 0400) Pulse Rate: 75 (10/26 0400)  Labs: Recent Labs    04/18/18 1839 04/18/18 2224 04/19/18 0406  HGB 8.0*  --  7.4*  HCT 30.5*  --  27.7*  PLT 306  --  287  HEPARINUNFRC  --   --  0.11*  CREATININE 0.83  --   --   TROPONINI  --  0.38*  --     Estimated Creatinine Clearance: 61.6 mL/min (by C-G formula based on SCr of 0.83 mg/dL).  Assessment: 52 y.o. female with chest pain for heparin   Goal of Therapy:  Heparin level 0.3-0.7 units/ml Monitor platelets by anticoagulation protocol: Yes   Plan:  Heparin 1500 units IV bolus, then increase heparin 850 units/hr Check heparin level in 6 hours.    Narvel Kozub, Gary Fleet 04/19/2018,4:58 AM

## 2018-04-19 NOTE — ED Notes (Signed)
Pt started having 4/10 chest pain with "feeling hot" that she reports is similar in nature to chest pain she experienced yesterday. This pain started around 0845, HR. EKG captured and given to EDP Dr Jacqulyn [facility]. Posterior EKG obtained at 0853. Patient reports pain is resolving at this time.

## 2018-04-20 ENCOUNTER — Other Ambulatory Visit: Payer: Self-pay

## 2018-04-20 ENCOUNTER — Other Ambulatory Visit (HOSPITAL_COMMUNITY): Payer: Self-pay

## 2018-04-20 ENCOUNTER — Inpatient Hospital Stay (HOSPITAL_COMMUNITY): Payer: Medicaid Other

## 2018-04-20 DIAGNOSIS — R9431 Abnormal electrocardiogram [ECG] [EKG]: Secondary | ICD-10-CM

## 2018-04-20 LAB — HEPARIN LEVEL (UNFRACTIONATED): HEPARIN UNFRACTIONATED: 0.47 [IU]/mL (ref 0.30–0.70)

## 2018-04-20 LAB — ECHOCARDIOGRAM COMPLETE
Height: 54 in
Weight: 2560 oz

## 2018-04-20 LAB — BPAM RBC
Blood Product Expiration Date: 201911182359
ISSUE DATE / TIME: 201910260723
Unit Type and Rh: 6200

## 2018-04-20 LAB — CBC
HCT: 31.1 % — ABNORMAL LOW (ref 36.0–46.0)
Hemoglobin: 8.8 g/dL — ABNORMAL LOW (ref 12.0–15.0)
MCH: 23.3 pg — ABNORMAL LOW (ref 26.0–34.0)
MCHC: 28.3 g/dL — AB (ref 30.0–36.0)
MCV: 82.3 fL (ref 80.0–100.0)
NRBC: 0 % (ref 0.0–0.2)
Platelets: 293 10*3/uL (ref 150–400)
RBC: 3.78 MIL/uL — AB (ref 3.87–5.11)
RDW: 15.3 % (ref 11.5–15.5)
WBC: 6.4 10*3/uL (ref 4.0–10.5)

## 2018-04-20 LAB — TYPE AND SCREEN
ABO/RH(D): A POS
Antibody Screen: NEGATIVE
Unit division: 0

## 2018-04-20 LAB — TROPONIN I: Troponin I: 0.32 ng/mL (ref <0.03)

## 2018-04-20 MED ORDER — ASPIRIN 81 MG PO CHEW
81.0000 mg | CHEWABLE_TABLET | Freq: Every day | ORAL | Status: DC
  Administered 2018-04-22 – 2018-04-23 (×2): 81 mg via ORAL
  Filled 2018-04-20 (×2): qty 1

## 2018-04-20 MED ORDER — SODIUM CHLORIDE 0.9% FLUSH: 3.0000 mL | INTRAVENOUS | Status: DC | PRN

## 2018-04-20 MED ORDER — SODIUM CHLORIDE 0.9 % IV SOLN: 250.0000 mL | INTRAVENOUS | Status: DC | PRN

## 2018-04-20 MED ORDER — SODIUM CHLORIDE 0.9 % IV SOLN
INTRAVENOUS | Status: DC
  Administered 2018-04-20: via INTRAVENOUS

## 2018-04-20 MED ORDER — SODIUM CHLORIDE 0.9% FLUSH
3.0000 mL | Freq: Two times a day (BID) | INTRAVENOUS | Status: DC
  Administered 2018-04-20: 3 mL via INTRAVENOUS

## 2018-04-20 MED ORDER — ASPIRIN 81 MG PO CHEW
81.0000 mg | CHEWABLE_TABLET | ORAL | Status: AC
  Administered 2018-04-21: 81 mg via ORAL
  Filled 2018-04-20: qty 1

## 2018-04-20 NOTE — Progress Notes (Signed)
   Addendum: On review of the echo. The inferolateral wall is hypokinetic.   Arvilla Meres, MD  7:30 PM

## 2018-04-20 NOTE — Progress Notes (Signed)
ANTICOAGULATION CONSULT NOTE  Pharmacy Consult for heparin Indication: chest pain/ACS  Allergies  Allergen Reactions  . Pork-Derived Products     Patient Measurements: Height: 4\' 6"  (137.2 cm) Weight: (Unable to get pt's wt b/c of bed swtich & pt unable to stand) IBW/kg (Calculated) : 31.7 Heparin Dosing Weight: 49.5 kg  Vital Signs: Temp: 98.4 F (36.9 C) (10/27 0819) Temp Source: Oral (10/27 0819) BP: 115/60 (10/27 0819) Pulse Rate: 73 (10/27 0819)  Labs: Recent Labs    04/18/18 1839 04/18/18 2224 04/19/18 0406 04/19/18 0631 04/19/18 1147 04/20/18 0345  HGB 8.0*  --  7.4*  --  9.1* 8.8*  HCT 30.5*  --  27.7*  --  32.0* 31.1*  PLT 306  --  287  --   --  293  HEPARINUNFRC  --   --  0.11*  --  0.51 0.47  CREATININE 0.83  --  0.81  --   --   --   TROPONINI  --  0.38*  --  0.61*  --   --     Estimated Creatinine Clearance: 63.1 mL/min (by C-G formula based on SCr of 0.81 mg/dL).   Medical History: Past Medical History:  Diagnosis Date  . Hypertension     Medications:  Scheduled:  . aspirin  81 mg Oral Daily  . atorvastatin  40 mg Oral q1800  . metoprolol tartrate  12.5 mg Oral BID  . nitroGLYCERIN  1 inch Topical Q6H  . sodium chloride flush  3 mL Intravenous Q12H   Infusions:  . sodium chloride    . heparin 850 Units/hr (04/19/18 2059)    Assessment: Pt is a 52 YOF presenting with sharp chest pain, worsening throughout the day. Not on anticoagulation PTA. Pharmacy has been consulted for heparin dosing for ACS/chest pain. Heparin level therapeutic. Hg low stable 8.8 s/p transfusion 10/26, Feraheme x 1. Plt wnl. No bleed issues documented.  Goal of Therapy:  Heparin level 0.3-0.7 units/ml Monitor platelets by anticoagulation protocol: Yes   Plan:  Continue heparin gtt at 600 units/hr Monitor daily heparin level and CBC, s/sx bleeding Cath planned for Monday  Babs Bertin, PharmD, BCPS Clinical Pharmacist Clinical phone [phone] Please check  AMION for all [facility] contact numbers 04/20/2018 10:25 AM

## 2018-04-20 NOTE — Progress Notes (Signed)
  Echocardiogram 2D Echocardiogram has been performed.  [name] 04/20/2018, 3:43 PM

## 2018-04-20 NOTE — Progress Notes (Signed)
Progress Note  Patient Name: [name] Date of Encounter: 04/20/2018  Primary Cardiologist: New  Subjective   No significnat chest pain overnight or this AM  Inpatient Medications    Scheduled Meds: . aspirin  81 mg Oral Daily  . atorvastatin  40 mg Oral q1800  . metoprolol tartrate  12.5 mg Oral BID  . nitroGLYCERIN  1 inch Topical Q6H  . sodium chloride flush  3 mL Intravenous Q12H   Continuous Infusions: . sodium chloride    . heparin 850 Units/hr (04/19/18 2059)   PRN Meds: sodium chloride, acetaminophen, nitroGLYCERIN, sodium chloride flush   Vital Signs    Vitals:   04/19/18 2015 04/20/18 0017 04/20/18 0415 04/20/18 0819  BP: 118/67 125/62 126/60 115/60  Pulse: 72 78 70 73  Resp: 17 18 19 18   Temp: 97.8 F (36.6 C) 98.4 F (36.9 C) 99 F (37.2 C) 98.4 F (36.9 C)  TempSrc: Oral Oral Oral Oral  SpO2: 100% 96% 99% 96%  Weight:      Height:        Intake/Output Summary (Last 24 hours) at 04/20/2018 0917 Last data filed at 04/20/2018 0303 Gross per 24 hour  Intake 1054.02 ml  Output 150 ml  Net 904.02 ml   Filed Weights   04/18/18 2000  Weight: 72.6 kg    Telemetry    NSR - Personally Reviewed  ECG    na  Physical Exam   GEN: No acute distress.   Neck: No JVD Cardiac: RRR, 2/6 systolic murmur rusb no, rubs, or gallops.  Respiratory: Clear to auscultation bilaterally. GI: Soft, nontender, non-distended  MS: No edema; No deformity. Neuro:  Nonfocal  Psych: Normal affect   Labs    Chemistry Recent Labs  Lab 04/18/18 1839 04/19/18 0406  NA 134* 138  K 3.5 4.1  CL 102 110  CO2 23 22  GLUCOSE 123* 84  BUN 10 8  CREATININE 0.83 0.81  CALCIUM 8.6* 8.1*  GFRNONAA >60 >60  GFRAA >60 >60  ANIONGAP 9 6     Hematology Recent Labs  Lab 04/18/18 1839 04/18/18 2224 04/19/18 0406 04/19/18 1147 04/20/18 0345  WBC 5.1  --  6.2  --  6.4  RBC 3.60* 3.26* 3.29*  --  3.78*  HGB 8.0*  --  7.4* 9.1* 8.8*  HCT 30.5*  --  27.7*  32.0* 31.1*  MCV 84.7  --  84.2  --  82.3  MCH 22.2*  --  22.5*  --  23.3*  MCHC 26.2*  --  26.7*  --  28.3*  RDW 15.0  --  15.2  --  15.3  PLT 306  --  287  --  293    Cardiac Enzymes Recent Labs  Lab 04/18/18 2224 04/19/18 0631  TROPONINI 0.38* 0.61*    Recent Labs  Lab 04/18/18 1850  TROPIPOC 0.16*     BNP Recent Labs  Lab 04/18/18 2224  BNP 287.6*     DDimer No results for input(s): DDIMER in the last 168 hours.   Radiology    Dg Chest 2 View  Result Date: 04/18/2018 CLINICAL DATA:  Pt states having medial chest pains for 2 weeks got severe today with SOB> Hx of HTN EXAM: CHEST - 2 VIEW COMPARISON:  None. FINDINGS: Heart size is normal. The lungs are free of focal consolidations and pleural effusions. No pulmonary edema. There is mild anterior wedge deformity of midthoracic vertebral bodies. IMPRESSION: 1.  No evidence for acute cardiopulmonary  abnormality. 2. Midthoracic wedge compression fractures are probably chronic and warrant further evaluation. Recommend DXA scan if not performed recently. Electronically Signed   By: Norva Pavlov M.D.   On: 04/18/2018 19:04    Cardiac Studies     Patient Profile     Becci Batty is a 52 y.o. female with a history of HF, HTN and anemia who presents to the ED with chest pain. Previously followed in [place], reported history is self reported.   Assessment & Plan    1. Chest pain - trop up to 0.61, has not peaked. Continue to cycle. EKG inferior and lateral TWI and ST depressions in setting of LVH, strain vs ischemia. - echo pending. From Dr Melburn Popper note bedside echo LVEF 50-55%, probable inferior hypokinesis - medical therapy with ASA, atorva 40, hep gtt, lopressor 12.5mg  bid. COnsider ACE-I after cath.   - plan for cath Monday pending morning Hgb. Will go ahead and arrange.    2. Anemia - admit Hgb 8, trended down to 7.4. Transfused 1 unit prbcs - continue to follow trend.  - patient has had recent heavy  menses, has not increased on heparin this admission.   3) Wedge compression -Noted on ED CXR -Will need outpatient f/u    I have reviewed the risks, indications, and alternatives to cardiac catheterization, possible angioplasty, and stenting with the patient and he husband today. Risks include but are not limited to bleeding, infection, vascular injury, stroke, myocardial infection, arrhythmia, kidney injury, radiation-related injury in the case of prolonged fluoroscopy use, emergency cardiac surgery, and death. The patient understands the risks of serious complication is 1-2 in 1000 with diagnostic cardiac cath and 1-2% or less with angioplasty/stenting.    For questions or updates, please contact [facility] Please consult [email or web address] for contact info under        Signed, Dina Rich, MD  04/20/2018, 9:17 AM

## 2018-04-21 ENCOUNTER — Encounter (HOSPITAL_COMMUNITY)
Admission: EM | Disposition: A | Discharge status: Home / Self Care | Payer: Self-pay | Source: Home / Self Care | Attending: Cardiothoracic Surgery

## 2018-04-21 DIAGNOSIS — E785 Hyperlipidemia, unspecified: Secondary | ICD-10-CM

## 2018-04-21 DIAGNOSIS — I2510 Atherosclerotic heart disease of native coronary artery without angina pectoris: Secondary | ICD-10-CM

## 2018-04-21 HISTORY — PX: LEFT HEART CATH AND CORONARY ANGIOGRAPHY: CATH118249

## 2018-04-21 LAB — CBC
HEMATOCRIT: 30.8 % — AB (ref 36.0–46.0)
HEMOGLOBIN: 8.6 g/dL — AB (ref 12.0–15.0)
MCH: 23.1 pg — ABNORMAL LOW (ref 26.0–34.0)
MCHC: 27.9 g/dL — AB (ref 30.0–36.0)
MCV: 82.8 fL (ref 80.0–100.0)
Platelets: 304 10*3/uL (ref 150–400)
RBC: 3.72 MIL/uL — ABNORMAL LOW (ref 3.87–5.11)
RDW: 15.6 % — ABNORMAL HIGH (ref 11.5–15.5)
WBC: 6.3 10*3/uL (ref 4.0–10.5)
nRBC: 0 % (ref 0.0–0.2)

## 2018-04-21 LAB — HEPATIC FUNCTION PANEL
ALT: 21 U/L (ref 0–44)
AST: 23 U/L (ref 15–41)
Albumin: 2.6 g/dL — ABNORMAL LOW (ref 3.5–5.0)
Alkaline Phosphatase: 85 U/L (ref 38–126)
Total Bilirubin: 0.5 mg/dL (ref 0.3–1.2)
Total Protein: 7.1 g/dL (ref 6.5–8.1)

## 2018-04-21 LAB — SURGICAL PCR SCREEN
MRSA, PCR: POSITIVE — AB
Staphylococcus aureus: POSITIVE — AB

## 2018-04-21 LAB — HEPARIN LEVEL (UNFRACTIONATED): Heparin Unfractionated: 0.42 IU/mL (ref 0.30–0.70)

## 2018-04-21 SURGERY — LEFT HEART CATH AND CORONARY ANGIOGRAPHY
Anesthesia: LOCAL

## 2018-04-21 MED ORDER — CHLORHEXIDINE GLUCONATE CLOTH 2 % EX PADS
6.0000 | MEDICATED_PAD | Freq: Every day | CUTANEOUS | Status: DC
  Administered 2018-04-21 – 2018-04-24 (×4): 6 via TOPICAL

## 2018-04-21 MED ORDER — HEPARIN SODIUM (PORCINE) 1000 UNIT/ML IJ SOLN
INTRAMUSCULAR | Status: DC | PRN
  Administered 2018-04-21: 1500 [IU] via INTRAVENOUS
  Administered 2018-04-21: 3500 [IU] via INTRAVENOUS

## 2018-04-21 MED ORDER — FENTANYL CITRATE (PF) 100 MCG/2ML IJ SOLN
INTRAMUSCULAR | Status: AC
  Filled 2018-04-21: qty 2

## 2018-04-21 MED ORDER — SODIUM CHLORIDE 0.9% FLUSH: 3.0000 mL | INTRAVENOUS | Status: DC | PRN

## 2018-04-21 MED ORDER — LIDOCAINE HCL (PF) 1 % IJ SOLN
INTRAMUSCULAR | Status: AC
  Filled 2018-04-21: qty 30

## 2018-04-21 MED ORDER — HEPARIN (PORCINE) IN NACL 100-0.45 UNIT/ML-% IJ SOLN
900.0000 [IU]/h | INTRAMUSCULAR | Status: DC
  Administered 2018-04-22: 850 [IU]/h via INTRAVENOUS
  Administered 2018-04-23: 900 [IU]/h via INTRAVENOUS
  Filled 2018-04-21 (×2): qty 250

## 2018-04-21 MED ORDER — SODIUM CHLORIDE 0.9 % IV SOLN: INTRAVENOUS | Status: AC

## 2018-04-21 MED ORDER — VERAPAMIL HCL 2.5 MG/ML IV SOLN
INTRAVENOUS | Status: AC
  Filled 2018-04-21: qty 2

## 2018-04-21 MED ORDER — MUPIROCIN 2 % EX OINT
1.0000 "application " | TOPICAL_OINTMENT | Freq: Two times a day (BID) | CUTANEOUS | Status: AC
  Administered 2018-04-21 – 2018-04-25 (×10): 1 via NASAL
  Filled 2018-04-21 (×5): qty 22

## 2018-04-21 MED ORDER — FENTANYL CITRATE (PF) 100 MCG/2ML IJ SOLN
INTRAMUSCULAR | Status: DC | PRN
  Administered 2018-04-21: 25 ug via INTRAVENOUS

## 2018-04-21 MED ORDER — LIDOCAINE HCL (PF) 1 % IJ SOLN
INTRAMUSCULAR | Status: DC | PRN
  Administered 2018-04-21: 2 mL via INTRADERMAL

## 2018-04-21 MED ORDER — VERAPAMIL HCL 2.5 MG/ML IV SOLN
INTRAVENOUS | Status: DC | PRN
  Administered 2018-04-21: 10 mL via INTRA_ARTERIAL
  Administered 2018-04-21: .8 mL via INTRA_ARTERIAL

## 2018-04-21 MED ORDER — NITROGLYCERIN 1 MG/10 ML FOR IR/CATH LAB
INTRA_ARTERIAL | Status: DC | PRN
  Administered 2018-04-21: 200 ug via INTRA_ARTERIAL

## 2018-04-21 MED ORDER — SODIUM CHLORIDE 0.9 % IV SOLN
250.0000 mL | INTRAVENOUS | Status: DC | PRN
  Administered 2018-04-22: 250 mL via INTRAVENOUS

## 2018-04-21 MED ORDER — ISOSORBIDE MONONITRATE ER 30 MG PO TB24
30.0000 mg | ORAL_TABLET | Freq: Every day | ORAL | Status: DC
  Administered 2018-04-21 – 2018-04-23 (×3): 30 mg via ORAL
  Filled 2018-04-21 (×3): qty 1

## 2018-04-21 MED ORDER — MIDAZOLAM HCL 2 MG/2ML IJ SOLN
INTRAMUSCULAR | Status: DC | PRN
  Administered 2018-04-21: 1 mg via INTRAVENOUS

## 2018-04-21 MED ORDER — IOHEXOL 350 MG/ML SOLN
INTRAVENOUS | Status: DC | PRN
  Administered 2018-04-21: 55 mL via INTRA_ARTERIAL

## 2018-04-21 MED ORDER — HEPARIN (PORCINE) IN NACL 1000-0.9 UT/500ML-% IV SOLN
INTRAVENOUS | Status: DC | PRN
  Administered 2018-04-21 (×2): 500 mL

## 2018-04-21 MED ORDER — HEPARIN (PORCINE) IN NACL 1000-0.9 UT/500ML-% IV SOLN
INTRAVENOUS | Status: AC
  Filled 2018-04-21: qty 1000

## 2018-04-21 MED ORDER — MIDAZOLAM HCL 2 MG/2ML IJ SOLN
INTRAMUSCULAR | Status: AC
  Filled 2018-04-21: qty 2

## 2018-04-21 MED ORDER — SODIUM CHLORIDE 0.9% FLUSH
3.0000 mL | Freq: Two times a day (BID) | INTRAVENOUS | Status: DC
  Administered 2018-04-21 – 2018-04-23 (×4): 3 mL via INTRAVENOUS

## 2018-04-21 SURGICAL SUPPLY — 15 items
CATH INFINITI 4FR JL3.5 (CATHETERS) ×2 IMPLANT
CATH INFINITI MULTIPACK ANG 4F (CATHETERS) ×2 IMPLANT
CATH OPTITORQUE TIG 4.0 5F (CATHETERS) ×2 IMPLANT
DEVICE RAD TR BAND REGULAR (VASCULAR PRODUCTS) ×2 IMPLANT
GLIDESHEATH SLEND A-KIT 6F 22G (SHEATH) ×2 IMPLANT
GUIDEWIRE INQWIRE 1.5J.035X260 (WIRE) ×1 IMPLANT
INQWIRE 1.5J .035X260CM (WIRE) ×2
KIT HEART LEFT (KITS) ×2 IMPLANT
PACK CARDIAC CATHETERIZATION (CUSTOM PROCEDURE TRAY) ×2 IMPLANT
SHEATH PINNACLE 5F 10CM (SHEATH) IMPLANT
SHEATH PROBE COVER 6X72 (BAG) ×2 IMPLANT
TRANSDUCER W/STOPCOCK (MISCELLANEOUS) ×2 IMPLANT
TUBING CIL FLEX 10 FLL-RA (TUBING) ×2 IMPLANT
WIRE EMERALD 3MM-J .035X150CM (WIRE) IMPLANT
WIRE HI TORQ VERSACORE-J 145CM (WIRE) IMPLANT

## 2018-04-21 NOTE — Progress Notes (Signed)
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 Patient placed on Contact Precautions.

## 2018-04-21 NOTE — Progress Notes (Signed)
Pt to cath lab.

## 2018-04-21 NOTE — Plan of Care (Signed)
  Problem: Education: Goal: Understanding of cardiac disease, CV risk reduction, and recovery process will improve Outcome: Progressing   Problem: Activity: Goal: Ability to tolerate increased activity will improve Outcome: Progressing   Problem: Education: Goal: Knowledge of General Education information will improve Description Including pain rating scale, medication(s)/side effects and non-pharmacologic comfort measures Outcome: Progressing   Problem: Clinical Measurements: Goal: Will remain free from infection Outcome: Progressing   Problem: Activity: Goal: Risk for activity intolerance will decrease Outcome: Progressing

## 2018-04-21 NOTE — Progress Notes (Signed)
ANTICOAGULATION CONSULT NOTE - Follow Up Consult  Pharmacy Consult for Heparin Indication: chest pain/ACS  Allergies  Allergen Reactions  . Pork-Derived Products     Patient Measurements: Height: 4\' 6"  (137.2 cm) Weight: 155 lb 12.8 oz (70.7 kg)(Scale B) IBW/kg (Calculated) : 31.7 Heparin Dosing Weight:    Vital Signs: Temp: 98.5 F (36.9 C) (10/28 1138) Temp Source: Oral (10/28 1138) BP: 138/81 (10/28 1731) Pulse Rate: 66 (10/28 1731)  Labs: Recent Labs    04/18/18 1839 04/18/18 2224  04/19/18 0406 04/19/18 0631 04/19/18 1147 04/20/18 0345 04/20/18 0940 04/21/18 0355  HGB 8.0*  --   --  7.4*  --  9.1* 8.8*  --  8.6*  HCT 30.5*  --   --  27.7*  --  32.0* 31.1*  --  30.8*  PLT 306  --   --  287  --   --  293  --  304  HEPARINUNFRC  --   --    < > 0.11*  --  0.51 0.47  --  0.42  CREATININE 0.83  --   --  0.81  --   --   --   --   --   TROPONINI  --  0.38*  --   --  0.61*  --   --  0.32*  --    < > = values in this interval not displayed.    Estimated Creatinine Clearance: 62 mL/min (by C-G formula based on SCr of 0.81 mg/dL).   Assessment: 52 yo female with NSTEMI post cath with severe multivessel disease. Pharmacy consulted to restart heparin 8 hours post sheath (removed ~ 5:30pm. TR band placed) -heparin was at 850 units/hr prior to cath with last heparin level = 0.42  Goal of Therapy:  Heparin level 0.3-0.7 units/ml Monitor platelets by anticoagulation protocol: Yes   Plan:  -restart heparin at 850 units/hr at 1:30am -Heparin level in 8 hours and daily wth CBC daily  Harland German, PharmD Clinical Pharmacist Please check Amion for pharmacy contact number

## 2018-04-21 NOTE — Progress Notes (Addendum)
Progress Note  Patient Name: [name] Date of Encounter: 04/21/2018  Primary Cardiologist: No primary care provider on file. - new this admission but seen by Bensimhon and Branch who would not cover general cards in [place]. Likely new to [place].  Subjective   No CP or SOB, just anxious to proceed with cath. I called lab and was told somewhere around 3-3:30.  Inpatient Medications    Scheduled Meds: . [START ON 04/22/2018] aspirin  81 mg Oral Daily  . atorvastatin  40 mg Oral q1800  . Chlorhexidine Gluconate Cloth  6 each Topical Q0600  . metoprolol tartrate  12.5 mg Oral BID  . mupirocin ointment  1 application Nasal BID  . nitroGLYCERIN  1 inch Topical Q6H  . sodium chloride flush  3 mL Intravenous Q12H   Continuous Infusions: . sodium chloride    . sodium chloride 75 mL/hr at 04/21/18 0430  . heparin 850 Units/hr (04/21/18 0430)   PRN Meds: sodium chloride, acetaminophen, nitroGLYCERIN, sodium chloride flush   Vital Signs    Vitals:   04/21/18 0000 04/21/18 0430 04/21/18 0803 04/21/18 1138  BP: 113/63 126/67 129/66 119/65  Pulse: 60 63 63 62  Resp: 12 16 16 16   Temp: 98.6 F (37 C) 98.8 F (37.1 C) 98.4 F (36.9 C) 98.5 F (36.9 C)  TempSrc: Oral Oral Oral Oral  SpO2: 96% 98% 98% 97%  Weight:      Height:        Intake/Output Summary (Last 24 hours) at 04/21/2018 1312 Last data filed at 04/21/2018 0430 Gross per 24 hour  Intake 774.52 ml  Output -  Net 774.52 ml   Filed Weights   04/18/18 2000 04/20/18 2234  Weight: 72.6 kg 70.7 kg    Telemetry    NSR - Personally Reviewed  Physical Exam   GEN: No acute distress.  HEENT: Normocephalic, atraumatic, sclera non-icteric. Neck: No JVD or bruits. Cardiac: RRR no murmurs, rubs, or gallops.  Radials/DP/PT 1+ and equal bilaterally.  Respiratory: Clear to auscultation bilaterally. Breathing is unlabored. GI: Soft, nontender, non-distended, BS +x 4. MS: no deformity. Extremities: No clubbing or  cyanosis. No edema. Distal pedal pulses are 2+ and equal bilaterally. Neuro:  AAOx3. Follows commands. Psych:  Responds to questions appropriately with a normal affect.  Labs    Chemistry Recent Labs  Lab 04/18/18 1839 04/19/18 0406  NA 134* 138  K 3.5 4.1  CL 102 110  CO2 23 22  GLUCOSE 123* 84  BUN 10 8  CREATININE 0.83 0.81  CALCIUM 8.6* 8.1*  GFRNONAA >60 >60  GFRAA >60 >60  ANIONGAP 9 6     Hematology Recent Labs  Lab 04/19/18 0406 04/19/18 1147 04/20/18 0345 04/21/18 0355  WBC 6.2  --  6.4 6.3  RBC 3.29*  --  3.78* 3.72*  HGB 7.4* 9.1* 8.8* 8.6*  HCT 27.7* 32.0* 31.1* 30.8*  MCV 84.2  --  82.3 82.8  MCH 22.5*  --  23.3* 23.1*  MCHC 26.7*  --  28.3* 27.9*  RDW 15.2  --  15.3 15.6*  PLT 287  --  293 304    Cardiac Enzymes Recent Labs  Lab 04/18/18 2224 04/19/18 0631 04/20/18 0940  TROPONINI 0.38* 0.61* 0.32*    Recent Labs  Lab 04/18/18 1850  TROPIPOC 0.16*     BNP Recent Labs  Lab 04/18/18 2224  BNP 287.6*     DDimer No results for input(s): DDIMER in the last 168 hours.  Radiology    No results found.  Cardiac Studies   Echo 10/27 Study Conclusions - Left ventricle: The cavity size was normal. Wall thickness was   normal. Systolic function was normal. The estimated ejection   fraction was in the range of 55% to 65%. Wall motion was normal;   there were no regional wall motion abnormalities. - Mitral valve: There was mild regurgitation. Bensimhon reviewed and felt The inferolateral wall is hypokinetic.   Patient Profile     51 y.o. female with prior dx of ?HF (was on lisinopril/carvedilol PTA), HTN, possible prior anemia admitted with CP, elevated troponin and anemia. Received 1 unit PRBC   Assessment & Plan    1. Chest pain/possible NSTEMI - peak troponin 0.61. Per prior notes, plan cath today which was already discussed in detail with patient and Dr. Wyline Mood. Continue ASA, heparin drip, NTG, atorvastatin, metoprolol.     2. History of ? Congestive heart failure - EF normal with possible inferior hypokinesis. Appears euvolemic.  3. Anemia, unspecified - admit Hgb 8, trended down to 7.4. Transfused 1 unit prbcs, received feraheme. Will also order hemoccult for completeness. Pt reports heavy menses, has not increased on heparin this admission. Denies any other bleeding but thinks she's been told she was anemic in the past too. Hgb 8.8 yesterday, 8.6 today. F/u in AM.  4.Wedge compression - Noted on ED CXR. Will needoutpatient f/u.  5. Hyperlipidemia - LDL 132. Started on statin. If the patient is tolerating statin at time of follow-up appointment, would consider rechecking liver function/lipid panel in 6-8 weeks. Check baseline LFTs.  For questions or updates, please contact [facility] Please consult [email or web address] for contact info under Cardiology/STEMI.  Signed, Laurann Montana, PA-C 04/21/2018, 1:12 PM    Patient seen and examined in the Cath Lab.  No further chest pain.  Plan for cardiac catheterization today.  Inferior wall motion abnormality on echo.  Risks, benefits, alternatives and indications discussed briefly.  She had no further questions after being discussed on the floor.  Bryan Lemma, M.D., M.S. Interventional Cardiologist   Pager # [phone] Phone # [phone] [address] [place], [place] [address]

## 2018-04-21 NOTE — Progress Notes (Signed)
Patient educational video offered for cath lab procedure. Patient educated on how to use and order cath lab video. Patient declined video.

## 2018-04-21 NOTE — Progress Notes (Signed)
ANTICOAGULATION CONSULT NOTE - Follow Up Consult  Pharmacy Consult for Heparin Indication: chest pain/ACS  Allergies  Allergen Reactions  . Pork-Derived Products     Patient Measurements: Height: 4\' 6"  (137.2 cm) Weight: 155 lb 12.8 oz (70.7 kg)(Scale B) IBW/kg (Calculated) : 31.7 Heparin Dosing Weight:    Vital Signs: Temp: 98.4 F (36.9 C) (10/28 0803) Temp Source: Oral (10/28 0803) BP: 129/66 (10/28 0803) Pulse Rate: 63 (10/28 0803)  Labs: Recent Labs    04/18/18 1839 04/18/18 2224  04/19/18 0406 04/19/18 0631 04/19/18 1147 04/20/18 0345 04/20/18 0940 04/21/18 0355  HGB 8.0*  --   --  7.4*  --  9.1* 8.8*  --  8.6*  HCT 30.5*  --   --  27.7*  --  32.0* 31.1*  --  30.8*  PLT 306  --   --  287  --   --  293  --  304  HEPARINUNFRC  --   --    < > 0.11*  --  0.51 0.47  --  0.42  CREATININE 0.83  --   --  0.81  --   --   --   --   --   TROPONINI  --  0.38*  --   --  0.61*  --   --  0.32*  --    < > = values in this interval not displayed.    Estimated Creatinine Clearance: 62 mL/min (by C-G formula based on SCr of 0.81 mg/dL).   Assessment: Anticoag: none PTA. Heparin for ACS/chest pain. +troponins. Hgb 8.6 down. Plts stable. HL 0.42 in goal. - 10/25 s/p transfusion, plt wnl  Goal of Therapy:  Heparin level 0.3-0.7 units/ml Monitor platelets by anticoagulation protocol: Yes   Plan:  Continue heparin gtt at 850 units/hr Monitor daily heparin level and CBC, s/sx bleeding  Quinto Tippy S. Merilynn Finland, PharmD, BCPS Clinical Staff Pharmacist Misty Stanley Stillinger 04/21/2018,11:09 AM

## 2018-04-22 ENCOUNTER — Encounter (HOSPITAL_COMMUNITY): Payer: Self-pay | Admitting: Cardiology

## 2018-04-22 ENCOUNTER — Other Ambulatory Visit: Payer: Self-pay | Admitting: *Deleted

## 2018-04-22 DIAGNOSIS — E785 Hyperlipidemia, unspecified: Secondary | ICD-10-CM

## 2018-04-22 DIAGNOSIS — I2510 Atherosclerotic heart disease of native coronary artery without angina pectoris: Secondary | ICD-10-CM

## 2018-04-22 DIAGNOSIS — D649 Anemia, unspecified: Secondary | ICD-10-CM

## 2018-04-22 DIAGNOSIS — I214 Non-ST elevation (NSTEMI) myocardial infarction: Secondary | ICD-10-CM

## 2018-04-22 DIAGNOSIS — I25110 Atherosclerotic heart disease of native coronary artery with unstable angina pectoris: Secondary | ICD-10-CM

## 2018-04-22 LAB — BLOOD GAS, ARTERIAL
Acid-base deficit: 0.7 mmol/L (ref 0.0–2.0)
Bicarbonate: 23.4 mmol/L (ref 20.0–28.0)
Drawn by: 34560
FIO2: 0.21
O2 Saturation: 97.3 %
Patient temperature: 98.6
pCO2 arterial: 37.7 mmHg (ref 32.0–48.0)
pH, Arterial: 7.409 (ref 7.350–7.450)
pO2, Arterial: 102 mmHg (ref 83.0–108.0)

## 2018-04-22 LAB — CBC
HEMATOCRIT: 30 % — AB (ref 36.0–46.0)
Hemoglobin: 8.3 g/dL — ABNORMAL LOW (ref 12.0–15.0)
MCH: 23.1 pg — ABNORMAL LOW (ref 26.0–34.0)
MCHC: 27.7 g/dL — AB (ref 30.0–36.0)
MCV: 83.6 fL (ref 80.0–100.0)
Platelets: 283 10*3/uL (ref 150–400)
RBC: 3.59 MIL/uL — ABNORMAL LOW (ref 3.87–5.11)
RDW: 15.4 % (ref 11.5–15.5)
WBC: 4.5 10*3/uL (ref 4.0–10.5)
nRBC: 0 % (ref 0.0–0.2)

## 2018-04-22 LAB — HEPARIN LEVEL (UNFRACTIONATED)
HEPARIN UNFRACTIONATED: 0.28 [IU]/mL — AB (ref 0.30–0.70)
Heparin Unfractionated: <0.1 IU/mL — ABNORMAL LOW (ref 0.30–0.70)

## 2018-04-22 LAB — URINALYSIS, ROUTINE W REFLEX MICROSCOPIC
Bilirubin Urine: NEGATIVE
Glucose, UA: NEGATIVE mg/dL
Ketones, ur: NEGATIVE mg/dL
Leukocytes, UA: NEGATIVE
Nitrite: NEGATIVE
Protein, ur: NEGATIVE mg/dL
Specific Gravity, Urine: 1.01 (ref 1.005–1.030)
pH: 5 (ref 5.0–8.0)

## 2018-04-22 LAB — OCCULT BLOOD X 1 CARD TO LAB, STOOL: Fecal Occult Bld: NEGATIVE

## 2018-04-22 NOTE — Progress Notes (Signed)
Pt reports 10/10 chest pain that feels like "someone is stepping on my chest." Pt diaphoretic. BP 171/77. 1x nitro sublingual given. Chest pain resolved after nitro BP rechecked 142/72. Will continue to monitor.  Versie Starks, RN

## 2018-04-22 NOTE — Consult Note (Addendum)
[name]       [place] [address]             [phone]        [name] [facility] Medical Record #[id number] Date of Birth: 28-Aug-1965  Referring: No ref. provider found Primary Care: Patient, No Pcp Per Primary Cardiologist:No primary care provider on file.  Chief Complaint:    Chief Complaint  Patient presents with  . Chest Pain    History of Present Illness:    Patient is a 52 year old female referred to cardiothoracic G for consideration of CABG.  She has 3 vessel disease as described in the catheterization report below.  She presented to the emergency department on 04/18/2018 with chest pain.  She has a history of heart failure and hypertension as well as anemia.  The pain at presentation occurred intermittently over the past approximate 2 weeks.  The pain would be sharp in nature it was not associated with any specific activity.  On the date of presentation she developed symptoms approximately 3 hours after eating dinner.  Prior to 2 weeks ago she has not had any of the symptoms.  She also denied any associated dyspnea or other cardiac symptoms.  EKG in the emergency department showed left ventricular hypertrophy with ST depression and T wave inversion in the inferolateral leads.  She was noted to have an elevated troponin of 0.16 and also her hemoglobin was noted to be low at 8.0.  Reportedly she does have a history of anemia but the etiology is uncertain to her.  She was given nitroglycerin and morphine with improvement of her pain and cardiology consultation was obtained.  The patient is undergone full evaluation including cardiac catheterization and echocardiogram as described below.  Ejection fraction is noted to be in the 45 to 50% by visual estimate on the catheterization.  He does have moderately elevated LVEDP.  We are asked to see in consultation for consideration of potential CABG.    Current Activity/ Functional Status: Patient is  independent with mobility/ambulation, transfers, ADL's, IADL's.   Zubrod Score: At the time of surgery this patient's most appropriate activity status/level should be described as: _0     0    Normal activity, no symptoms _1     1    Restricted in physical strenuous activity but ambulatory, able to do out light work _2     2    Ambulatory and capable of self care, unable to do work activities, up and about                 more than 50%  Of the time                            _3     3    Only limited self care, in bed greater than 50% of waking hours _4     4    Completely disabled, no self care, confined to bed or chair _5     5    Moribund  Past Medical History:  Diagnosis Date  . Hypertension     Past Surgical History:  Procedure Laterality Date  . CESAREAN SECTION    . LEFT HEART CATH AND CORONARY ANGIOGRAPHY N/A 04/21/2018   Procedure: LEFT HEART CATH AND CORONARY ANGIOGRAPHY;  Surgeon: Leonie Man, MD;  Location: [facility];  Service: Cardiovascular;  Laterality: N/A;    Social History  Tobacco Use  Smoking Status Never Smoker  Smokeless Tobacco Current User  . Types: Chew    Social History   Substance and Sexual Activity  Alcohol Use Not Currently  . Frequency: Never     Allergies  Allergen Reactions  . Pork-Derived Products     Current Facility-Administered Medications  Medication Dose Route Frequency Provider Last Rate Last Dose  . 0.9 %  sodium chloride infusion  250 mL Intravenous PRN Leonie Man, MD      . acetaminophen (TYLENOL) tablet 650 mg  650 mg Oral Q4H PRN Leonie Man, MD   650 mg at 04/21/18 2209  . aspirin chewable tablet 81 mg  81 mg Oral Daily Leonie Man, MD   81 mg at 04/22/18 8413  . atorvastatin (LIPITOR) tablet 40 mg  40 mg Oral q1800 Leonie Man, MD   40 mg at 04/21/18 1901  . Chlorhexidine Gluconate Cloth 2 % PADS 6 each  6 each Topical Q0600 Leonie Man, MD   6 each at 04/22/18 0511  . heparin ADULT  infusion 100 units/mL (25000 units/272m sodium chloride 0.45%)  850 Units/hr Intravenous Continuous MKris Mouton RPH 8.5 mL/hr at 04/22/18 0223 850 Units/hr at 04/22/18 0223  . isosorbide mononitrate (IMDUR) 24 hr tablet 30 mg  30 mg Oral Daily HLeonie Man MD   30 mg at 04/21/18 2204  . metoprolol tartrate (LOPRESSOR) tablet 12.5 mg  12.5 mg Oral BID HLeonie Man MD   12.5 mg at 04/22/18 0835  . mupirocin ointment (BACTROBAN) 2 % 1 application  1 application Nasal BID HLeonie Man MD   1 application at 124/40/[phone] . nitroGLYCERIN (NITROSTAT) SL tablet 0.4 mg  0.4 mg Sublingual Q5 min PRN HLeonie Man MD      . sodium chloride flush (NS) 0.9 % injection 3 mL  3 mL Intravenous Q12H HLeonie Man MD   3 mL at 04/22/18 1005  . sodium chloride flush (NS) 0.9 % injection 3 mL  3 mL Intravenous PRN HLeonie Man MD        Medications Prior to Admission  Medication Sig Dispense Refill Last Dose  . aspirin 81 MG chewable tablet Chew 81 mg by mouth daily.    04/18/2018 at 0700  . carvedilol (COREG) 6.25 MG tablet Take 6.25 mg by mouth 2 (two) times daily with a meal.   04/18/2018 at 0700  . lisinopril (PRINIVIL,ZESTRIL) 40 MG tablet Take 40 mg by mouth daily.   04/18/2018 at Unknown time    Family History  Problem Relation Age of Onset  . Hypertension Father      Review of Systems:   Review of Systems  Constitutional: Positive for malaise/fatigue. Negative for chills, diaphoresis, fever and weight loss.  HENT: Negative for congestion, ear discharge, ear pain, hearing loss, nosebleeds, sinus pain, sore throat and tinnitus.   Eyes: Negative for blurred vision, double vision, photophobia, pain, discharge and redness.  Respiratory: Negative for cough, hemoptysis, sputum production, shortness of breath, wheezing and stridor.   Cardiovascular: Positive for chest pain and orthopnea. Negative for palpitations, claudication, leg swelling and PND.  Gastrointestinal:  Negative for abdominal pain, blood in stool, constipation, diarrhea, heartburn, melena, nausea and vomiting.  Genitourinary: Negative for dysuria, flank pain, frequency, hematuria and urgency.  Musculoskeletal: Negative for back pain, falls, joint pain, myalgias and neck pain.  Skin: Negative for itching and rash.  Neurological: Positive for weakness. Negative for dizziness, tingling,  tremors, sensory change, speech change, focal weakness, seizures, loss of consciousness and headaches.  Endo/Heme/Allergies: Negative for environmental allergies and polydipsia. Bruises/bleeds easily.  Psychiatric/Behavioral: Negative for depression, hallucinations, memory loss, substance abuse and suicidal ideas. The patient is not nervous/anxious and does not have insomnia.     Physical Exam: BP 110/62 (BP Location: Left Arm)   Pulse 63   Temp 98.7 F (37.1 C) (Oral)   Resp 16   Ht _0  (1.372 m)   Wt 70.7 kg Comment: Scale B  SpO2 97%   BMI 37.56 kg/m    Physical Exam  Constitutional: She is oriented to person, place, and time. She appears well-developed and well-nourished. No distress.  HENT:  Head: Normocephalic and atraumatic.  Mouth/Throat: Oropharynx is clear and moist. No oropharyngeal exudate.  Fair dentition   Eyes: Pupils are equal, round, and reactive to light. Conjunctivae and EOM are normal. Right eye exhibits no discharge. Left eye exhibits no discharge. No scleral icterus.  Neck: Normal range of motion. Neck supple. No JVD present. No tracheal deviation present. No thyromegaly present.  Bilateral carotid bruits vs transmitted murmur L>R  Cardiovascular: Normal rate, regular rhythm, normal heart sounds and intact distal pulses. Exam reveals no gallop and no friction rub.  Soft systolic murmur  Pulmonary/Chest: Effort normal and breath sounds normal. No stridor. No respiratory distress. She has no wheezes. She has no rales. She exhibits no tenderness.  Abdominal: Soft. Bowel sounds are  normal. She exhibits no distension and no mass. There is no tenderness. There is no rebound and no guarding.  Musculoskeletal: Normal range of motion. She exhibits deformity. She exhibits no edema or tenderness.  Lymphadenopathy:    She has no cervical adenopathy.  Neurological: She is alert and oriented to person, place, and time. No cranial nerve deficit. She exhibits normal muscle tone.  Skin: Skin is warm and dry. No rash noted. She is not diaphoretic. No erythema. No pallor.  Psychiatric: She has a normal mood and affect. Her behavior is normal. Judgment and thought content normal.      Diagnostic Studies & Laboratory data:     Recent Radiology Findings:   No results found.   I have independently reviewed the above radiologic studies and discussed with the patient   Recent Lab Findings: Lab Results  Component Value Date   WBC 4.5 04/22/2018   HGB 8.3 (L) 04/22/2018   HCT 30.0 (L) 04/22/2018   PLT 283 04/22/2018   GLUCOSE 84 04/19/2018   CHOL 194 04/19/2018   TRIG 57 04/19/2018   HDL 51 04/19/2018   LDLCALC 132 (H) 04/19/2018   ALT 21 04/21/2018   AST 23 04/21/2018   NA 138 04/19/2018   K 4.1 04/19/2018   CL 110 04/19/2018   CREATININE 0.81 04/19/2018   BUN 8 04/19/2018   CO2 22 04/19/2018   HGBA1C 5.9 (H) 04/18/2018    LEFT HEART CATH AND CORONARY ANGIOGRAPHY  Conclusion     There is mild left ventricular systolic dysfunction. The left ventricular ejection fraction is 45-50% by visual estimate. Inferior hypokinesis  LV end diastolic pressure is moderately elevated.  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 Prox RCA to Mid RCA lesion is 99% stenosed. Mid RCA to Dist RCA lesion is 100% stenosed.  Ost LAD lesion is 70% stenosed. Ost LAD to Prox LAD lesion is 55% stenosed. Prox LAD lesion is 80% stenosed. Mid LAD lesion is 70% stenosed.  Colon Flattery  Cx to Prox Cx lesion is  70% stenosed. Prox Cx to Mid Cx lesion is 90% stenosed.   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 Reduced EF with inferior hypokinesis.  Moderately elevated LVEDP.  RECOMMENDATION Return to nursing unit for ongoing care Referral for CVTS consultation.  She does have decent targets in the distal LAD and circumflex.  The RCA may not be graftable. Restart IV heparin 8 hours after sheath removal. Continue aggressive risk factor modification with lipid and blood pressure management. Will check for diabetes.  Arteries appear to be quite diabetic in appearance.  -If CVTS is not an option, would consider PCI of the circumflex as the most likely culprit area with medical management of the remainder.  The LAD would be extensive relatively small diameter stenting.  Recommend uninterrupted dual antiplatelet therapy with Aspirin 34m daily and Clopidogrel 731mdaily for a minimum of 12 months (ACS - Class I recommendation).  Would start Plavix post CABG    DaGlenetta HewM.D., M.S. Interventional Cardiologist   Pager # [phone] # [phone] [place] [id number] Indications   Non-ST elevation (NSTEMI) myocardial infarction ([facility][I21.4 (ICD-10-CM)]  Procedural Details/Technique   Technical Details PCP: Patient, No Pcp Per CARDIOLOGIST: New  Posey Ratti is a [age]ear old woman with a history ofHF, HTN and anemiawho presents to the ED with chest pain.[place]. She ruled in for non-ST elevation MI with mild troponin elevations. She has been relatively pain-free over the weekend. Echocardiogram suggested mildly reduced EF with inferior hypokinesis. She is therefore referred for invasive evaluation with cardiac catheterization and possible PCI.  Time Out: Verified patient  identification, verified procedure, site/side was marked, verified correct patient position, special equipment/implants available, medications/allergies/relevent history reviewed, required imaging and test results available. Performed. Consent Signed.   Access:  * RIGHT Radial Artery: 6 Fr sheath -- Seldinger technique using Angiocath Micropuncture Kit ** 10 mL radial cocktail IA; total of 5000 units IV Heparin ** Direct ultrasound guidance. A permanent image was obtained and placed in chart..  Left Heart Catheterization: 4 and 5 Fr Catheters advanced or exchanged over a J-wire under direct fluoroscopic guidance into the ascending aorta; 5 French TIG 4.0 catheter advanced first. The TIG catheter would not advance beyond the elbow. I removed the wire and took a brief angiogram revealing a very small caliber brachial artery. Additional 2 mg verapamil was administered, and a switch to 4 FrPakistanatheters. We also gave her additional sedation. I was able to then do the remainder the cath with 4 French catheters. * Left Coronary Artery Cineangiography: 4 French JL3.5 catheter  * Right Coronary Artery Cineangiography: 4 FrPakistanR4 catheter  * LV Hemodynamics (LV Gram): 4 FrQatar- After completion of angiography, the catheter was removed completely out of the body over wire without complication.  Radial sheath(s) removed in the Cath Lab with TR band placed for hemostasis.   TR Band: 1435 Hours; 14 mL air  MEDICATIONS * SQ Lidocaine 70m70m Radial Cocktail: 3 mg Verapamil in 10 mL NS -an additional 2 mg was administered * Isovue Contrast: 55 mL * Heparin: Total 5000 units * IA NTG 200 mcg x 1  Fluoroscopy time: 4.6 min Radiation dose: 10241 mGy/cm2   Estimated blood loss <50 mL.  During this procedure the patient was administered the following to achieve  and maintain moderate conscious sedation: Versed 1 mg, Fentanyl 25 mcg, while the patient's heart rate, blood pressure, and oxygen  saturation were continuously monitored. The period of conscious sedation was 12 minutes, of which I was present face-to-face 100% of this time.  Complications   Complications documented before study signed (04/22/2018 6:22 AM EDT)    No complications were associated with this study.  Documented by Leonie Man, MD - 04/21/2018 5:41 PM EDT    Coronary Findings   Diagnostic  Dominance: Right  Left Anterior Descending  Ost LAD lesion 70% stenosed  Ost LAD lesion is 70% stenosed. The lesion is focal and irregular. The lesion is mildly calcified.  Ost LAD to Prox LAD lesion 55% stenosed  Ost LAD to Prox LAD lesion is 55% stenosed. The lesion is segmental, eccentric and irregular.  Prox LAD lesion 80% stenosed  Prox LAD lesion is 80% stenosed. The lesion is concentric.  Mid LAD lesion 70% stenosed  Mid LAD lesion is 70% stenosed. The lesion is eccentric.  First Diagonal Branch  Vessel is small in size.  First Septal Branch  Vessel is small in size.  Second Septal Branch  Vessel is small in size.  Left Circumflex  Ost Cx to Prox Cx lesion 70% stenosed  Ost Cx to Prox Cx lesion is 70% stenosed. The lesion is located at the bend and eccentric.  Prox Cx to Mid Cx lesion 90% stenosed  Prox Cx to Mid Cx lesion is 90% stenosed. Vessel is the culprit lesion. The lesion is focal, irregular and ulcerative.  First Obtuse Marginal Branch  Vessel is small in size.  Left Atrioventricular Groove Continuation  Vessel is small in size.  Right Coronary Artery  Vessel is moderate in size.  Ost RCA to Prox RCA lesion 90% stenosed  Ost RCA to Prox RCA lesion is 90% stenosed. The lesion is eccentric and irregular.  Prox RCA to Mid RCA lesion 99% stenosed  Prox RCA to Mid RCA lesion is 99% stenosed. The lesion is segmental and eccentric.  Mid RCA to Dist RCA lesion 100% stenosed  Mid RCA to Dist RCA lesion is 100% stenosed. The lesion is chronically occluded with left-to-right collateral flow.    Acute Marginal Branch  Vessel is small in size.  Right Posterior Descending Artery  Vessel is small in size. There is moderate disease in the vessel.  Collaterals  RPDA filled by collaterals from 2nd Sept.    Inferior Septal  Vessel is small in size.  Collaterals  Inf Sept filled by collaterals from 1st Sept.    Right Posterior Atrioventricular Branch  Vessel is small in size. There is moderate disease in the vessel.  Intervention   No interventions have been documented.  Wall Motion   Resting               Left Heart   Left Ventricle The left ventricular size is normal. There is mild left ventricular systolic dysfunction. LV end diastolic pressure is moderately elevated. The left ventricular ejection fraction is 45-50% by visual estimate. There are LV function abnormalities due to segmental dysfunction. There is trivial (1+) mitral regurgitation.  Aortic Valve There is no aortic valve stenosis. There is normal aortic valve motion.  Coronary Diagrams   Diagnostic Diagram       Implants      Result status: Final result                              *  [facility]*                         [place], [place] [address]                            [phone]  ------------------------------------------------------------------- Transthoracic Echocardiography  Patient:    [name], [name] MR #:       [id number] Study Date: 04/20/2018 Gender:     F Age:        74 Height:     137.2 cm Weight:     72.6 kg BSA:        1.71 m^2 Pt. Status: Room:       4E15C   ADMITTING    Rozann Lesches, M.D.  SONOGRAPHER  Johny Chess, RDCS, CCT  ATTENDING    Virgel Manifold [id number]  PERFORMING   [facility], Inpatient  ORDERING     Nila Nephew  cc:  ------------------------------------------------------------------- LV EF: 55% -    65%  ------------------------------------------------------------------- Indications:      Abnormal EKG 794.31.  ------------------------------------------------------------------- History:   Risk factors:  NSTEMI. History of heart failure. Hypertension.  ------------------------------------------------------------------- Study Conclusions  - Left ventricle: The cavity size was normal. Wall thickness was   normal. Systolic function was normal. The estimated ejection   fraction was in the range of 55% to 65%. Wall motion was normal;   there were no regional wall motion abnormalities. - Mitral valve: There was mild regurgitation.  ------------------------------------------------------------------- Study data:  No prior study was available for comparison.  Study status:  Routine.  Procedure:  The patient reported no pain pre or post test. Transthoracic echocardiography. Image quality was good. Study completion:  There were no complications. Transthoracic echocardiography.  M-mode, complete 2D, spectral Doppler, and color Doppler.  Birthdate:  Patient birthdate: [birth year].  Age:  Patient is 52 yr old.  Sex:  Gender: female. BMI: 38.6 kg/m^2.  Blood pressure:     107/64  Patient status: Inpatient.  Study date:  Study date: 04/20/2018. Study time: 03:16 PM.  Location:  Bedside.  -------------------------------------------------------------------  ------------------------------------------------------------------- Left ventricle:  The cavity size was normal. Wall thickness was normal. Systolic function was normal. The estimated ejection fraction was in the range of 55% to 65%. Wall motion was normal; there were no regional wall motion abnormalities.  ------------------------------------------------------------------- Aortic valve:   Mildly thickened leaflets. Cusp separation was normal.  Doppler:  Transvalvular velocity was within the normal range. There was no stenosis.  There was no regurgitation.  ------------------------------------------------------------------- Aorta:  Aortic root: The aortic root was normal in size. Ascending aorta: The ascending aorta was normal in size.  ------------------------------------------------------------------- Mitral valve:   Structurally normal valve.   Leaflet separation was normal.  Doppler:  Transvalvular velocity was within the normal range. There was no evidence for stenosis. There was mild regurgitation.    Peak gradient (D): 5 mm Hg.  ------------------------------------------------------------------- Left atrium:  The atrium was normal in size.  ------------------------------------------------------------------- Right ventricle:  The cavity size was normal. Systolic function was normal.  ------------------------------------------------------------------- Pulmonic valve:    The valve appears to be grossly normal. Doppler:  There was no significant regurgitation.  -------------------------------------------------------------------  Tricuspid valve:   The valve appears to be grossly normal. Doppler:  There was no significant regurgitation.  ------------------------------------------------------------------- Right atrium:  The atrium was normal in size.  ------------------------------------------------------------------- Pericardium:  There was no pericardial effusion.  ------------------------------------------------------------------- Systemic veins: Inferior vena cava: The vessel was normal in size. The respirophasic diameter changes were in the normal range (>= 50%), consistent with normal central venous pressure.  ------------------------------------------------------------------- Measurements   Left ventricle                         Value        Reference  LV ID, ED, PLAX chordal                46    mm     43 - 52  LV ID, ES, PLAX chordal                32    mm     23 - 38  LV fx  shortening, PLAX chordal         30    %      >=29  LV PW thickness, ED                    11    mm     ----------  IVS/LV PW ratio, ED                    0.91         <=1.3  Stroke volume, 2D                      62    ml     ----------  Stroke volume/bsa, 2D                  36    ml/m^2 ----------  LV e&', lateral                         12.3  cm/s   ----------  LV E/e&', lateral                       8.7          ----------  LV e&', medial                          6.64  cm/s   ----------  LV E/e&', medial                        16.11        ----------  LV e&', average                         9.47  cm/s   ----------  LV E/e&', average                       11.3         ----------    Ventricular septum                     Value        Reference  IVS thickness, ED  10    mm     ----------    LVOT                                   Value        Reference  LVOT ID, S                             19    mm     ----------  LVOT area                              2.84  cm^2   ----------  LVOT peak velocity, S                  106   cm/s   ----------  LVOT mean velocity, S                  75.7  cm/s   ----------  LVOT VTI, S                            22    cm     ----------    Aorta                                  Value        Reference  Aortic root ID, ED                     25    mm     ----------    Left atrium                            Value        Reference  LA ID, A-P, ES                         38    mm     ----------  LA ID/bsa, A-P                 (H)     2.22  cm/m^2 <=2.2  LA volume, S                           51.3  ml     ----------  LA volume/bsa, S                       30    ml/m^2 ----------  LA volume, ES, 1-p A4C                 46.1  ml     ----------  LA volume/bsa, ES, 1-p A4C             27    ml/m^2 ----------  LA volume, ES, 1-p A2C                 56.7  ml     ----------  LA volume/bsa, ES, 1-p A2C             33.2  ml/m^2 ----------  Mitral valve                           Value        Reference  Mitral E-wave peak velocity            107   cm/s   ----------  Mitral A-wave peak velocity            77.7  cm/s   ----------  Mitral deceleration time               225   ms     150 - 230  Mitral peak gradient, D                5     mm Hg  ----------  Mitral E/A ratio, peak                 1.4          ----------    Right atrium                           Value        Reference  RA ID, S-I, ES, A4C                    44.3  mm     34 - 49  RA area, ES, A4C                       13.7  cm^2   8.3 - 19.5  RA volume, ES, A/L                     34.3  ml     ----------  RA volume/bsa, ES, A/L                 20.1  ml/m^2 ----------    Systemic veins                         Value        Reference  Estimated CVP                          3     mm Hg  ----------    Right ventricle                        Value        Reference  TAPSE                                  20    mm     ----------  RV s&', lateral, S                      14.8  cm/s   ----------  Legend: (L)  and  (H)  mark values outside specified reference range.  ------------------------------------------------------------------- Prepared and Electronically Authenticated by  Mertie Moores, M.D. 2019-10-27T16:31:08  MERGE Images   Show images for ECHOCARDIOGRAM COMPLETE  Patient Information   Patient Name [name], [name] Sex Female DOB [birth year] SSN [id number]  Reason for Exam  Priority: Routine  Comments:   Surgical History   Surgical History   No  past medical history on file.    Other Surgical History   Procedure Laterality Date Comment Source  CESARE[facility] SECTION    Provider  LEFT HEART CATH AND CORONARY ANGIOGRAPHY N/A 04/21/2018 Procedure: LEFT HEART CATH AND CORONARY ANGIOGRAPHY; Surgeon: Leonie Man, MD; Location: [facility]; Service: Cardiovascular; Laterality: N/A; Provider    Patient Data   Height 54 in    BP 107/64 mmHg         Performing Technologist/Nurse   Performing Technologist/Nurse: Kenney Houseman, RDCS                    Implants    No active implants to display in this view.  Order-Level Documents - 04/18/2018:   Scan on 04/20/2018 12:31 PM by Default, Provider, MD  Scan on 04/20/2018 11:14 AM by Default, Provider, MD      Encounter-Level Documents - 04/18/2018:   Scan on 04/21/2018 5:35 PM by Default, Provider, MD  Document on 04/19/2018 5:18 PM by Virgel Manifold, MD: ED PB Billing Extract  Scan on 04/21/2018 9:15 PM by Default, Provider, MD  Scan on 04/19/2018 12:28 PM by Default, Provider, MD  Electronic signature on 04/19/2018 6:59 AM - Signed  Electronic signature on 04/18/2018 9:27 PM - Signed  Scan on 04/22/2018 9:07 AM by Default, Provider, MD  Scan on 04/18/2018 6:44 PM by Default, Provider, MD      Signed   Electronically signed by Thayer Headings, MD on 04/20/18 at 56 EDT      Assessment / Plan: Recent non-STEMI with severe multivessel coronary artery disease. Reported history of congestive heart failure but ejection fraction is normal on echocardiogram with possible inferior hypokinesis. Unspecified anemia, appears iron deficient, heme negative but may require further evaluation. Hypertension Hyperlipidemia with mildly elevated LDL  Plan: CABG        I  spent 55 minutes counseling the patient face to face.   John Giovanni, PA-C 04/22/2018 1:54 PM Pager [phone]  Agree with above assessment Coronary angiogram and echocardiogram images personally reviewed Although coronary targets are not optimal,and the disall RCA may not be graftable, CABG is the best long term therapy for the patient Surgery 10-31  patient examined and medical record reviewed,agree with above note. Tharon Aquas Trigt III 04/23/2018

## 2018-04-22 NOTE — Progress Notes (Addendum)
Progress Note  Patient Name: [name] Date of Encounter: 04/22/2018  Primary Cardiologist: Dr. Dina Rich, MD  Subjective   Patient feeling well today.  Denies chest pain or shortness of breath.  Anticipating TCTS consultation later today.  Inpatient Medications    Scheduled Meds: . aspirin  81 mg Oral Daily  . atorvastatin  40 mg Oral q1800  . Chlorhexidine Gluconate Cloth  6 each Topical Q0600  . isosorbide mononitrate  30 mg Oral Daily  . metoprolol tartrate  12.5 mg Oral BID  . mupirocin ointment  1 application Nasal BID  . sodium chloride flush  3 mL Intravenous Q12H   Continuous Infusions: . sodium chloride    . heparin 850 Units/hr (04/22/18 0223)   PRN Meds: sodium chloride, acetaminophen, nitroGLYCERIN, sodium chloride flush   Vital Signs    Vitals:   04/22/18 0234 04/22/18 0419 04/22/18 0834 04/22/18 0835  BP: 116/62 112/64 140/65 140/65  Pulse: 66 65  80  Resp: 14 17 12    Temp: 98.8 F (37.1 C) 98.8 F (37.1 C) 99 F (37.2 C)   TempSrc: Oral Oral Oral   SpO2: 98% 99% 98%   Weight:      Height:        Intake/Output Summary (Last 24 hours) at 04/22/2018 1214 Last data filed at 04/22/2018 0023 Gross per 24 hour  Intake -  Output 1025 ml  Net -1025 ml   Filed Weights   04/18/18 2000 04/20/18 2234  Weight: 72.6 kg 70.7 kg    Physical Exam   General: Obese, NAD Skin: Warm, dry, intact  Head: Normocephalic, atraumatic, clear, moist mucus membranes. Neck: Negative for carotid bruits. No JVD Lungs:Clear to ausculation bilaterally. No wheezes, rales, or rhonchi. Breathing is unlabored. Cardiovascular: RRR with S1 S2. No murmurs, rubs, gallops, or LV heave appreciated. Abdomen: Soft, non-tender, non-distended with normoactive bowel sounds. No hepatomegaly, No rebound/guarding. No obvious abdominal masses. MSK: Strength and tone appear normal for age. 5/5 in all extremities Extremities: No edema. No clubbing or cyanosis. DP/PT pulses  2+ bilaterally Neuro: Alert and oriented. No focal deficits. No facial asymmetry. MAE spontaneously. Psych: Responds to questions appropriately with normal affect.    Labs    Chemistry Recent Labs  Lab 04/18/18 1839 04/19/18 0406 04/21/18 0355  NA 134* 138  --   K 3.5 4.1  --   CL 102 110  --   CO2 23 22  --   GLUCOSE 123* 84  --   BUN 10 8  --   CREATININE 0.83 0.81  --   CALCIUM 8.6* 8.1*  --   PROT  --   --  7.1  ALBUMIN  --   --  2.6*  AST  --   --  23  ALT  --   --  21  ALKPHOS  --   --  85  BILITOT  --   --  0.5  GFRNONAA >60 >60  --   GFRAA >60 >60  --   ANIONGAP 9 6  --      Hematology Recent Labs  Lab 04/20/18 0345 04/21/18 0355 04/22/18 0237  WBC 6.4 6.3 4.5  RBC 3.78* 3.72* 3.59*  HGB 8.8* 8.6* 8.3*  HCT 31.1* 30.8* 30.0*  MCV 82.3 82.8 83.6  MCH 23.3* 23.1* 23.1*  MCHC 28.3* 27.9* 27.7*  RDW 15.3 15.6* 15.4  PLT 293 304 283    Cardiac Enzymes Recent Labs  Lab 04/18/18 2224 04/19/18 0631 04/20/18  0940  TROPONINI 0.38* 0.61* 0.32*    Recent Labs  Lab 04/18/18 1850  TROPIPOC 0.16*     BNP Recent Labs  Lab 04/18/18 2224  BNP 287.6*     DDimer No results for input(s): DDIMER in the last 168 hours.   Radiology    No results found.  Telemetry    04/22/2018 NSR- Personally Reviewed  ECG    No new tracings as of 04/22/2018- Personally Reviewed  Cardiac Studies   Cath 04/21/2018:  There is mild left ventricular systolic dysfunction. The left ventricular ejection fraction is 45-50% by visual estimate. Inferior hypokinesis  LV end diastolic pressure is moderately elevated.  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 Prox RCA to Mid RCA lesion is 99% stenosed. Mid RCA to Dist RCA lesion is 100% stenosed.  Ost LAD lesion is 70% stenosed. Ost LAD to Prox LAD lesion is 55% stenosed. Prox LAD lesion is 80% stenosed. Mid LAD lesion is 70%  stenosed.  Ost Cx to Prox Cx lesion is 70% stenosed. Prox Cx to Mid Cx lesion is 90% stenosed.   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 Reduced EF with inferior hypokinesis.  Moderately elevated LVEDP.  RECOMMENDATION Return to nursing unit for ongoing care Referral for CVTS consultation.  She does have decent targets in the distal LAD and circumflex.  The RCA may not be graftable. Restart IV heparin 8 hours after sheath removal. Continue aggressive risk factor modification with lipid and blood pressure management. Will check for diabetes.  Arteries appear to be quite diabetic in appearance.  -If CVTS is not an option, would consider PCI of the circumflex as the most likely culprit area with medical management of the remainder.  The LAD would be extensive relatively small diameter stenting.  Echocardiogram: 04/20/2018  Study Conclusions - Left ventricle: The cavity size was normal. Wall thickness was normal. Systolic function was normal. The estimated ejection fraction was in the range of 55% to 65%. Wall motion was normal; there were no regional wall motion abnormalities. - Mitral valve: There was mild regurgitation. Bensimhon reviewed and felt The inferolateral wall is hypokinetic.  Patient Profile     52 y.o. female with prior dx of ?HF (was on lisinopril/carvedilol PTA), HTN, possible prior anemia admitted with CP, elevated troponin and anemia.  Assessment & Plan    1. Chest pain/possible NSTEMI: -Cath 04/21/18 with severe multivessel disease: 100% proximal RCA occlusion with left-to-right collaterals, severe tandem lesions in the proximal LAD and proximal LCx.  Recommendations are for TCTS evaluation however, if this is not an option, per cath note, consider PCI of the circumflex as the most likely culprit area with medical management of the  remainder. -Continue IV heparin per pharmacy>> no s/s active bleeding -Continue ASA, heparin drip, NTG, atorvastatin, metoprolol.    2. History of ? Congestive heart failure: -EF normal with possible inferior hypokinesis.  -Appears euvolemic  3. Anemia, unspecified: -Hb today 8.3, down from 8.6 yesterday  -Admit Hgb 8, initially trended down to 7.4 and was transfused 1 unit prbcs- -Hemoccult, negative   -Continue to monitor and trend   4.Wedge compression: -Noted on ED CXR. Will needoutpatient f/u.  5. Hyperlipidemia: -LDL 132, started on statin.  -If the patient is tolerating statin at time of follow-up appointment, would consider rechecking liver function/lipid panel in 6-8 weeks.    Signed, Noreene Larsson  McDaniel NP-C [facility] Pager: [phone] 04/22/2018, 12:14 PM   ---------------------------------------------------------------------------------------------   History and all data above reviewed.  Patient examined.  I agree with the findings as above.  Jyl Demore is feeling well today and awaiting surgical consult.  Constitutional: No acute distress ENMT: normal dentition, moist mucous membranes Cardiovascular: regular rhythm, normal rate, no murmurs. S1 and S2 normal. Radial pulses normal bilaterally. No jugular venous distention.  Respiratory: clear to auscultation bilaterally GI : normal bowel sounds, soft and nontender. No distention.   MSK: extremities warm, well perfused. No edema.  LYMPH: No lymphadenopathy noted of the head and neck NEURO: grossly nonfocal exam, moves all extremities. PSYCH: alert and oriented x 3, normal mood and affect.   All available labs, radiology testing, previous records reviewed. Agree with documented assessment and plan of my colleague as stated above with the following additions or changes:  Active Problems:   NSTEMI (non-ST elevated myocardial infarction) (HCC)   Anemia   Hyperlipidemia   Plan: Cath showed multivessel  disease. We will await a surgical opinion on CABG. Continue medical therapy.   Parke Poisson, MD [facility] 8:45 PM  04/22/2018    For questions or updates, please contact   Please consult [email or web address] for contact info under Cardiology/STEMI.

## 2018-04-22 NOTE — Progress Notes (Signed)
ANTICOAGULATION CONSULT NOTE - Follow Up Consult  Pharmacy Consult for Heparin Indication: Severe multivessel disease.   Allergies  Allergen Reactions  . Pork-Derived Products     Patient Measurements: Height: 4\' 6"  (137.2 cm) Weight: 155 lb 12.8 oz (70.7 kg)(Scale B) IBW/kg (Calculated) : 31.7 Heparin Dosing Weight:    Vital Signs: Temp: 98.4 F (36.9 C) (10/29 1723) Temp Source: Oral (10/29 1723) BP: 107/60 (10/29 1723) Pulse Rate: 69 (10/29 1723)  Labs: Recent Labs    04/20/18 0345 04/20/18 0940 04/21/18 0355 04/22/18 0237 04/22/18 0926 04/22/18 1551  HGB 8.8*  --  8.6* 8.3*  --   --   HCT 31.1*  --  30.8* 30.0*  --   --   PLT 293  --  304 283  --   --   HEPARINUNFRC 0.47  --  0.42  --  <0.10* 0.28*  TROPONINI  --  0.32*  --   --   --   --     Estimated Creatinine Clearance: 62 mL/min (by C-G formula based on SCr of 0.81 mg/dL).   Assessment:  Anticoag: none PTA. Heparin for ACS/chest pain. +troponins. S/p cath lab  Severe multivessel disease plan CABG  Hgb 8.3 down slightly. Plts 283 stable. Heparin resumed drip rate 850 uts/hr HL 0.28 < goal will increase slightly  Goal of Therapy:  Heparin level 0.3-0.7 units/ml Monitor platelets by anticoagulation protocol: Yes   Plan:  Increase IV heparin at 900 units/hr.  Daily HL and CBC   Leota Sauers Pharm.D. CPP, BCPS Clinical Pharmacist [phone] 04/22/2018 5:45 PM

## 2018-04-22 NOTE — Progress Notes (Signed)
ANTICOAGULATION CONSULT NOTE - Follow Up Consult  Pharmacy Consult for Heparin Indication: Severe multivessel disease.   Allergies  Allergen Reactions  . Pork-Derived Products     Patient Measurements: Height: 4\' 6"  (137.2 cm) Weight: 155 lb 12.8 oz (70.7 kg)(Scale B) IBW/kg (Calculated) : 31.7 Heparin Dosing Weight:    Vital Signs: Temp: 99 F (37.2 C) (10/29 0834) Temp Source: Oral (10/29 0834) BP: 140/65 (10/29 0835) Pulse Rate: 80 (10/29 0835)  Labs: Recent Labs    04/20/18 0345 04/20/18 0940 04/21/18 0355 04/22/18 0237 04/22/18 0926  HGB 8.8*  --  8.6* 8.3*  --   HCT 31.1*  --  30.8* 30.0*  --   PLT 293  --  304 283  --   HEPARINUNFRC 0.47  --  0.42  --  <0.10*  TROPONINI  --  0.32*  --   --   --     Estimated Creatinine Clearance: 62 mL/min (by C-G formula based on SCr of 0.81 mg/dL).   Assessment:  Anticoag: none PTA. Heparin for ACS/chest pain. +troponins. Severe multivessel disease on cath. Hgb 8.3 down slightly. Plts 283 stable. Heparin resumed. HL <0.1. RN note that patient turned her IV pump off with heparin infusing early 10/29 AM. Spoke with day RN. Pt has not turned pump off any more. HL previously therapeutic at this rate. - 10/25 s/p transfusion, plt wnl  Goal of Therapy:  Heparin level 0.3-0.7 units/ml Monitor platelets by anticoagulation protocol: Yes   Plan:  Continue IV heparin at 850 units/hr. No changes since pump was previously off. Recheck level again in 6 hrs. Daily HL and CBC   Mechille Varghese S. Merilynn Finland, PharmD, BCPS Clinical Staff Pharmacist Misty Stanley Stillinger 04/22/2018,11:04 AM

## 2018-04-22 NOTE — Progress Notes (Signed)
Patient cut off IV pump (Heparin gtt); educated patient about: medication, call out or notify staff for help and not to interrupt or hinder medication on pump.  Will continue to monitor.

## 2018-04-22 NOTE — Progress Notes (Signed)
Occult Blood to lab - collected and sent at 00:20

## 2018-04-23 ENCOUNTER — Inpatient Hospital Stay (HOSPITAL_COMMUNITY): Payer: Medicaid Other

## 2018-04-23 DIAGNOSIS — Z01810 Encounter for preprocedural cardiovascular examination: Secondary | ICD-10-CM

## 2018-04-23 LAB — PULMONARY FUNCTION TEST
DL/VA % pred: 209 %
DL/VA: 6.35 ml/min/mmHg/L
DLCO cor % pred: 178 %
DLCO cor: 19.26 ml/min/mmHg
DLCO unc % pred: 146 %
DLCO unc: 15.85 ml/min/mmHg
FEF 25-75 Post: 3.49 L/sec
FEF 25-75 Pre: 2.27 L/sec
FEF2575-%Change-Post: 53 %
FEF2575-%Pred-Post: 171 %
FEF2575-%Pred-Pre: 111 %
FEV1-%Change-Post: 7 %
FEV1-%Pred-Post: 113 %
FEV1-%Pred-Pre: 105 %
FEV1-Post: 2.03 L
FEV1-Pre: 1.89 L
FEV1FVC-%Change-Post: 5 %
FEV1FVC-%Pred-Pre: 105 %
FEV6-%Change-Post: 2 %
FEV6-Post: 2.21 L
FEV6-Pre: 2.16 L
FVC-%Change-Post: 2 %
FVC-%Pred-Post: 101 %
FVC-%Pred-Pre: 99 %
FVC-Post: 2.21 L
FVC-Pre: 2.16 L
Post FEV1/FVC ratio: 92 %
Post FEV6/FVC ratio: 100 %
Pre FEV1/FVC ratio: 87 %
Pre FEV6/FVC Ratio: 100 %
RV % pred: 102 %
RV: 1.32 L
TLC % pred: 99 %
TLC: 3.55 L

## 2018-04-23 LAB — CBC
HCT: 31.5 % — ABNORMAL LOW (ref 36.0–46.0)
Hemoglobin: 8.8 g/dL — ABNORMAL LOW (ref 12.0–15.0)
MCH: 23.3 pg — AB (ref 26.0–34.0)
MCHC: 27.9 g/dL — ABNORMAL LOW (ref 30.0–36.0)
MCV: 83.3 fL (ref 80.0–100.0)
PLATELETS: 317 10*3/uL (ref 150–400)
RBC: 3.78 MIL/uL — AB (ref 3.87–5.11)
RDW: 15.7 % — ABNORMAL HIGH (ref 11.5–15.5)
WBC: 5.5 10*3/uL (ref 4.0–10.5)
nRBC: 0.4 % — ABNORMAL HIGH (ref 0.0–0.2)

## 2018-04-23 LAB — PROTIME-INR
INR: 1.08
Prothrombin Time: 13.9 seconds (ref 11.4–15.2)

## 2018-04-23 LAB — HEPARIN LEVEL (UNFRACTIONATED): HEPARIN UNFRACTIONATED: 0.38 [IU]/mL (ref 0.30–0.70)

## 2018-04-23 LAB — PREPARE RBC (CROSSMATCH)

## 2018-04-23 LAB — TSH: TSH: 1.3 u[IU]/mL (ref 0.350–4.500)

## 2018-04-23 MED ORDER — SODIUM CHLORIDE 0.9 % IV SOLN
1.5000 g | INTRAVENOUS | Status: AC
  Administered 2018-04-24: 1.5 g via INTRAVENOUS
  Filled 2018-04-23: qty 1.5

## 2018-04-23 MED ORDER — BISACODYL 5 MG PO TBEC
5.0000 mg | DELAYED_RELEASE_TABLET | Freq: Once | ORAL | Status: AC
  Administered 2018-04-23: 5 mg via ORAL
  Filled 2018-04-23: qty 1

## 2018-04-23 MED ORDER — DIAZEPAM 2 MG PO TABS
2.0000 mg | ORAL_TABLET | Freq: Once | ORAL | Status: AC
  Administered 2018-04-24: 2 mg via ORAL
  Filled 2018-04-23: qty 1

## 2018-04-23 MED ORDER — TRANEXAMIC ACID 1000 MG/10ML IV SOLN
1.5000 mg/kg/h | INTRAVENOUS | Status: DC
  Filled 2018-04-23: qty 25

## 2018-04-23 MED ORDER — TEMAZEPAM 7.5 MG PO CAPS: 15.0000 mg | ORAL_CAPSULE | Freq: Once | ORAL | Status: DC | PRN

## 2018-04-23 MED ORDER — METOPROLOL TARTRATE 12.5 MG HALF TABLET
12.5000 mg | ORAL_TABLET | Freq: Once | ORAL | Status: AC
  Administered 2018-04-24: 12.5 mg via ORAL
  Filled 2018-04-23: qty 1

## 2018-04-23 MED ORDER — DOPAMINE-DEXTROSE 3.2-5 MG/ML-% IV SOLN
0.0000 ug/kg/min | INTRAVENOUS | Status: DC
  Filled 2018-04-23: qty 250

## 2018-04-23 MED ORDER — SODIUM CHLORIDE 0.9 % IV SOLN
INTRAVENOUS | Status: DC
  Filled 2018-04-23: qty 30

## 2018-04-23 MED ORDER — PHENYLEPHRINE HCL-NACL 20-0.9 MG/250ML-% IV SOLN
30.0000 ug/min | INTRAVENOUS | Status: DC
  Filled 2018-04-23: qty 250

## 2018-04-23 MED ORDER — CHLORHEXIDINE GLUCONATE CLOTH 2 % EX PADS
6.0000 | MEDICATED_PAD | Freq: Once | CUTANEOUS | Status: AC
  Administered 2018-04-23: 6 via TOPICAL

## 2018-04-23 MED ORDER — ALPRAZOLAM 0.25 MG PO TABS
0.2500 mg | ORAL_TABLET | ORAL | Status: DC | PRN
  Administered 2018-04-23: 0.25 mg via ORAL
  Filled 2018-04-23: qty 2

## 2018-04-23 MED ORDER — TRANEXAMIC ACID (OHS) PUMP PRIME SOLUTION
2.0000 mg/kg | INTRAVENOUS | Status: DC
  Filled 2018-04-23: qty 1.41

## 2018-04-23 MED ORDER — INSULIN REGULAR(HUMAN) IN NACL 100-0.9 UT/100ML-% IV SOLN
INTRAVENOUS | Status: DC
  Filled 2018-04-23: qty 100

## 2018-04-23 MED ORDER — TRANEXAMIC ACID (OHS) BOLUS VIA INFUSION
15.0000 mg/kg | INTRAVENOUS | Status: AC
  Administered 2018-04-24: 1060.5 mg via INTRAVENOUS
  Filled 2018-04-23: qty 1061

## 2018-04-23 MED ORDER — ALBUTEROL SULFATE (2.5 MG/3ML) 0.083% IN NEBU
2.5000 mg | INHALATION_SOLUTION | Freq: Once | RESPIRATORY_TRACT | Status: AC
  Administered 2018-04-23: 2.5 mg via RESPIRATORY_TRACT

## 2018-04-23 MED ORDER — NOREPINEPHRINE 4 MG/250ML-% IV SOLN
0.0000 ug/min | INTRAVENOUS | Status: DC
  Administered 2018-04-24: 1 ug/min via INTRAVENOUS
  Filled 2018-04-23: qty 250

## 2018-04-23 MED ORDER — CHLORHEXIDINE GLUCONATE 0.12 % MT SOLN
15.0000 mL | Freq: Once | OROMUCOSAL | Status: AC
  Administered 2018-04-24: 15 mL via OROMUCOSAL
  Filled 2018-04-23: qty 15

## 2018-04-23 MED ORDER — EPINEPHRINE PF 1 MG/ML IJ SOLN
0.0000 ug/min | INTRAVENOUS | Status: DC
  Filled 2018-04-23: qty 4

## 2018-04-23 MED ORDER — MILRINONE LACTATE IN DEXTROSE 20-5 MG/100ML-% IV SOLN
0.3000 ug/kg/min | INTRAVENOUS | Status: AC
  Administered 2018-04-24: 0.125 ug/kg/min via INTRAVENOUS
  Filled 2018-04-23: qty 100

## 2018-04-23 MED ORDER — VANCOMYCIN HCL 10 G IV SOLR
1250.0000 mg | INTRAVENOUS | Status: AC
  Administered 2018-04-24: 1250 mg via INTRAVENOUS
  Filled 2018-04-23: qty 1250

## 2018-04-23 MED ORDER — POTASSIUM CHLORIDE 2 MEQ/ML IV SOLN
80.0000 meq | INTRAVENOUS | Status: DC
  Filled 2018-04-23: qty 40

## 2018-04-23 MED ORDER — NITROGLYCERIN IN D5W 200-5 MCG/ML-% IV SOLN
2.0000 ug/min | INTRAVENOUS | Status: AC
  Administered 2018-04-24: 10 ug/min via INTRAVENOUS
  Filled 2018-04-23: qty 250

## 2018-04-23 MED ORDER — PLASMA-LYTE 148 IV SOLN
INTRAVENOUS | Status: AC
  Administered 2018-04-24: 500 mL
  Filled 2018-04-23: qty 2.5

## 2018-04-23 MED ORDER — DEXMEDETOMIDINE HCL IN NACL 400 MCG/100ML IV SOLN
0.1000 ug/kg/h | INTRAVENOUS | Status: DC
  Filled 2018-04-23: qty 100

## 2018-04-23 MED ORDER — CHLORHEXIDINE GLUCONATE CLOTH 2 % EX PADS: 6.0000 | MEDICATED_PAD | Freq: Once | CUTANEOUS | Status: DC

## 2018-04-23 MED ORDER — MAGNESIUM SULFATE 50 % IJ SOLN
40.0000 meq | INTRAMUSCULAR | Status: DC
  Filled 2018-04-23: qty 9.85

## 2018-04-23 MED ORDER — SODIUM CHLORIDE 0.9 % IV SOLN
750.0000 mg | INTRAVENOUS | Status: DC
  Filled 2018-04-23: qty 750

## 2018-04-23 NOTE — Progress Notes (Signed)
Pre CABG Dopplers  Right Carotid:Velocities in the right ICA are consistent with a 1-39% stenosis.  Left Carotid: Velocities in the left ICA are consistent with a 40-59% stenosis. Upper end of range.   Right ABI: Pedal artery waveforms within normal limits.  Left ABI: Pedal artery waveforms within normal limits.    Right Upper Extremity: Doppler waveforms remain within normal limits with right radial compression. Doppler waveforms decrease >50% with right ulnar compression.  Left Upper Extremity: Doppler waveforms decrease 50% with left radial compression. Doppler waveforms remain within normal limits with left ulnar compression.   Farrel Demark, RDMS, RVT

## 2018-04-23 NOTE — Anesthesia Preprocedure Evaluation (Addendum)
Anesthesia Evaluation  Patient identified by MRN, date of birth, ID band Patient awake    Reviewed: Allergy & Precautions, NPO status , Patient's Chart, lab work & pertinent test results  Airway Mallampati: III  TM Distance: >3 FB Neck ROM: Full    Dental  (+) Dental Advisory Given   Pulmonary neg pulmonary ROS,    Pulmonary exam normal breath sounds clear to auscultation       Cardiovascular hypertension, + CAD and + Past MI  Normal cardiovascular exam Rhythm:Regular Rate:Normal     Neuro/Psych negative neurological ROS  negative psych ROS   GI/Hepatic negative GI ROS, Neg liver ROS,   Endo/Other  negative endocrine ROS  Renal/GU negative Renal ROS     Musculoskeletal negative musculoskeletal ROS (+)   Abdominal (+) + obese,   Peds  Hematology  (+) anemia ,   Anesthesia Other Findings   Reproductive/Obstetrics negative OB ROS                            Anesthesia Physical Anesthesia Plan  ASA: IV  Anesthesia Plan: General   Post-op Pain Management:    Induction: Intravenous  PONV Risk Score and Plan: 2 and Treatment may vary due to age or medical condition  Airway Management Planned: Oral ETT  Additional Equipment: Arterial line, CVP, PA Cath, TEE and Ultrasound Guidance Line Placement  Intra-op Plan:   Post-operative Plan: Post-operative intubation/ventilation  Informed Consent: I have reviewed the patients History and Physical, chart, labs and discussed the procedure including the risks, benefits and alternatives for the proposed anesthesia with the patient or authorized representative who has indicated his/her understanding and acceptance.   Dental advisory given  Plan Discussed with: CRNA  Anesthesia Plan Comments:         Anesthesia Quick Evaluation

## 2018-04-23 NOTE — Progress Notes (Addendum)
Progress Note  Patient Name: [name] Date of Encounter: 04/23/2018  Primary Cardiologist: Dr. Dina Rich, MD  Subjective   Anticipating CABG tomorrow 04/24/18. Denies chest pain or SOB. Doing well today. Anxious   Inpatient Medications    Scheduled Meds: . aspirin  81 mg Oral Daily  . atorvastatin  40 mg Oral q1800  . [START ON 04/24/2018] chlorhexidine  15 mL Mouth/Throat Once  . Chlorhexidine Gluconate Cloth  6 each Topical Q0600  . Chlorhexidine Gluconate Cloth  6 each Topical Once   And  . Chlorhexidine Gluconate Cloth  6 each Topical Once  . [START ON 04/24/2018] diazepam  2 mg Oral Once  . [START ON 04/24/2018] heparin-papaverine-plasmalyte irrigation   Irrigation To OR  . [START ON 04/24/2018] insulin   Intravenous To OR  . isosorbide mononitrate  30 mg Oral Daily  . [START ON 04/24/2018] magnesium sulfate  40 mEq Other To OR  . metoprolol tartrate  12.5 mg Oral BID  . [START ON 04/24/2018] metoprolol tartrate  12.5 mg Oral Once  . mupirocin ointment  1 application Nasal BID  . [START ON 04/24/2018] phenylephrine  30-200 mcg/min Intravenous To OR  . [START ON 04/24/2018] potassium chloride  80 mEq Other To OR  . sodium chloride flush  3 mL Intravenous Q12H  . [START ON 04/24/2018] tranexamic acid  15 mg/kg Intravenous To OR  . [START ON 04/24/2018] tranexamic acid  2 mg/kg Intracatheter To OR   Continuous Infusions: . sodium chloride 250 mL (04/22/18 1719)  . [START ON 04/24/2018] cefUROXime (ZINACEF)  IV    . [START ON 04/24/2018] cefUROXime (ZINACEF)  IV    . [START ON 04/24/2018] dexmedetomidine    . [START ON 04/24/2018] DOPamine    . [START ON 04/24/2018] epinephrine    . [START ON 04/24/2018] heparin 30,000 units/NS 1000 mL solution for CELLSAVER    . heparin 900 Units/hr (04/23/18 0952)  . [START ON 04/24/2018] milrinone    . [START ON 04/24/2018] nitroGLYCERIN    . [START ON 04/24/2018] norepinephrine (LEVOPHED) Adult infusion    . [START  ON 04/24/2018] tranexamic acid (CYKLOKAPRON) infusion ([facility])    . [START ON 04/24/2018] vancomycin     PRN Meds: sodium chloride, acetaminophen, ALPRAZolam, nitroGLYCERIN, sodium chloride flush, temazepam   Vital Signs    Vitals:   04/22/18 2336 04/23/18 0427 04/23/18 0742 04/23/18 1100  BP: (!) 155/88 108/60 109/61   Pulse: (!) 59 60 61   Resp: 19 17 19    Temp: 97.7 F (36.5 C) 98.6 F (37 C) 97.9 F (36.6 C)   TempSrc: Oral Oral Oral   SpO2: 100% 98% 98%   Weight:    71.2 kg  Height:    4\' 6"  (1.372 m)    Intake/Output Summary (Last 24 hours) at 04/23/2018 1145 Last data filed at 04/22/2018 2013 Gross per 24 hour  Intake 467.17 ml  Output 350 ml  Net 117.17 ml   Filed Weights   04/18/18 2000 04/20/18 2234 04/23/18 1100  Weight: 72.6 kg 70.7 kg 71.2 kg    Physical Exam   General: Obese, NAD Skin: Warm, dry, intact  Head: Normocephalic, atraumatic, clear, moist mucus membranes. Neck: Negative for carotid bruits. No JVD Lungs:Clear to ausculation bilaterally. No wheezes, rales, or rhonchi. Breathing is unlabored. Cardiovascular: RRR with S1 S2. No murmurs, rubs, gallops, or LV heave appreciated. Abdomen: Soft, non-tender, non-distended with normoactive bowel sounds. No obvious abdominal masses. MSK: Strength and tone appear normal for  age. 5/5 in all extremities Extremities: No edema. No clubbing or cyanosis. DP/PT pulses 2+ bilaterally Neuro: Alert and oriented. No focal deficits. No facial asymmetry. MAE spontaneously. Psych: Responds to questions appropriately with normal affect.    Labs    Chemistry Recent Labs  Lab 04/18/18 1839 04/19/18 0406 04/21/18 0355  NA 134* 138  --   K 3.5 4.1  --   CL 102 110  --   CO2 23 22  --   GLUCOSE 123* 84  --   BUN 10 8  --   CREATININE 0.83 0.81  --   CALCIUM 8.6* 8.1*  --   PROT  --   --  7.1  ALBUMIN  --   --  2.6*  AST  --   --  23  ALT  --   --  21  ALKPHOS  --   --  85  BILITOT  --   --  0.5  GFRNONAA  >60 >60  --   GFRAA >60 >60  --   ANIONGAP 9 6  --      Hematology Recent Labs  Lab 04/21/18 0355 04/22/18 0237 04/23/18 0345  WBC 6.3 4.5 5.5  RBC 3.72* 3.59* 3.78*  HGB 8.6* 8.3* 8.8*  HCT 30.8* 30.0* 31.5*  MCV 82.8 83.6 83.3  MCH 23.1* 23.1* 23.3*  MCHC 27.9* 27.7* 27.9*  RDW 15.6* 15.4 15.7*  PLT 304 283 317    Cardiac Enzymes Recent Labs  Lab 04/18/18 2224 04/19/18 0631 04/20/18 0940  TROPONINI 0.38* 0.61* 0.32*    Recent Labs  Lab 04/18/18 1850  TROPIPOC 0.16*     BNP Recent Labs  Lab 04/18/18 2224  BNP 287.6*     DDimer No results for input(s): DDIMER in the last 168 hours.   Radiology    No results found.  Telemetry    04/23/18 NSR- Personally Reviewed  ECG    No new tracings as of 04/23/18 - Personally Reviewed  Cardiac Studies   Cath 04/21/2018:  There is mild left ventricular systolic dysfunction. The left ventricular ejection fraction is 45-50% by visual estimate. Inferior hypokinesis  LV end diastolic pressure is moderately elevated.  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 Prox RCA to Mid RCA lesion is 99% stenosed. Mid RCA to Dist RCA lesion is 100% stenosed.  Ost LAD lesion is 70% stenosed. Ost LAD to Prox LAD lesion is 55% stenosed. Prox LAD lesion is 80% stenosed. Mid LAD lesion is 70% stenosed.  Ost Cx to Prox Cx lesion is 70% stenosed. Prox Cx to Mid Cx lesion is 90% stenosed.  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 Reduced EF with inferior hypokinesis. Moderately elevated LVEDP.  RECOMMENDATION Return to nursing unit for ongoing care Referral for CVTS consultation. She does have decent targets in the distal LAD and circumflex. The RCA may not be graftable. Restart IV heparin 8 hours after sheath  removal. Continue aggressive risk factor modification with lipid and blood pressure management. Will check for diabetes. Arteries appear to be quite diabetic in appearance.  -If CVTS is not an option, would consider PCI of the circumflex as the most likely culprit area with medical management of the remainder. The LAD would be extensive relatively small diameter stenting.  Echocardiogram: 04/20/2018 Study Conclusions - Left ventricle: The cavity size was normal. Wall thickness was normal. Systolic function  was normal. The estimated ejection fraction was in the range of 55% to 65%. Wall motion was normal; there were no regional wall motion abnormalities. - Mitral valve: There was mild regurgitation. Bensimhon reviewed and feltThe inferolateral wall is hypokinetic.  Patient Profile     52 y.o. female with prior dx of ?HF (was on lisinopril/carvedilol PTA), HTN, possible prior anemia admitted with CP, elevated troponin and anemia.  Assessment & Plan    1. Chest pain/possible NSTEMI: -Cath 04/21/18 with severe multivessel disease: 100% proximal RCA occlusion with left-to-right collaterals, severe tandem lesions in the proximal LAD and proximal LCx.   -TCTS consultation 04/22/18 with plan for CABG 04/24/18 -Continue IV heparin per pharmacy>> no s/s active bleeding -Continue ASA, heparin drip, NTG, atorvastatin, metoprolol  2. History of ? Congestive heart failure: -EF normal with possible inferior hypokinesis  -Appears euvolemic  3. Anemia, unspecified: -Hb today 8.8 -Hemoccult, negative   -Continue to monitor and trend   4. Hyperlipidemia: -LDL 132, started on statin -If the patient is tolerating statin at time of follow-up appointment, would consider rechecking liver function/lipid panel in 6-8 weeks.   Signed, Katherine Chard NP-C [facility] Pager: [phone] 04/23/2018, 11:45 AM     For questions or updates, please contact   Please consult  [email or web address] for contact info under Cardiology/STEMI. ---------------------------------------------------------------------------------------------   History and all data above reviewed.  Patient examined.  I agree with the findings as above.  [name] is anticipating CABG tomorrow. Feels well.  onstitutional: No acute distress ENMT: normal dentition, moist mucous membranes Cardiovascular: regular rhythm, normal rate, no murmurs. S1 and S2 normal. Radial pulses normal bilaterally. No jugular venous distention.  Respiratory: clear to auscultation bilaterally GI : normal bowel sounds, soft and nontender. No distention.   MSK: extremities warm, well perfused. No edema.  LYMPH: No lymphadenopathy noted of the head and neck NEURO: grossly nonfocal exam, moves all extremities. PSYCH: alert and oriented x 3, normal mood and affect.  All available labs, radiology testing, previous records reviewed. Agree with documented assessment and plan of my colleague as stated above with the following additions or changes:  Active Problems:   NSTEMI (non-ST elevated myocardial infarction) (HCC)   Anemia   Hyperlipidemia   Plan: Plan for CABG tomorrow, continue medical therapy. Anemia stable.   Parke Poisson, MD [facility] 4:25 PM  04/23/2018

## 2018-04-23 NOTE — Progress Notes (Signed)
Discussed sternal precautions, IS (1500 mL), mobility, and d/c planning with pt. Receptive. Gave her [facility] booklet and careguide and d/c video. Sts she will line up family to be with her at d/c. [phone] Ethelda Chick CES, ACSM 2:37 PM 04/23/2018

## 2018-04-23 NOTE — Progress Notes (Signed)
Patient refused to watch the preop open heart surgery videos she stated that ''I just want the surgery done will watch the video after surgery''. Will continue to monitor

## 2018-04-23 NOTE — Progress Notes (Signed)
ANTICOAGULATION CONSULT NOTE - Follow Up Consult  Pharmacy Consult for Heparin Indication: Severe multivessel disease.   Allergies  Allergen Reactions  . Pork-Derived Products     Patient Measurements: Height: 4\' 6"  (137.2 cm) Weight: 155 lb 12.8 oz (70.7 kg)(Scale B) IBW/kg (Calculated) : 31.7 Heparin Dosing Weight:    Vital Signs: Temp: 97.9 F (36.6 C) (10/30 0742) Temp Source: Oral (10/30 0742) BP: 109/61 (10/30 0742) Pulse Rate: 61 (10/30 0742)  Labs: Recent Labs    04/21/18 0355 04/22/18 0237 04/22/18 0926 04/22/18 1551 04/23/18 0345  HGB 8.6* 8.3*  --   --  8.8*  HCT 30.8* 30.0*  --   --  31.5*  PLT 304 283  --   --  317  LABPROT  --   --   --   --  13.9  INR  --   --   --   --  1.08  HEPARINUNFRC 0.42  --  <0.10* 0.28* 0.38    Estimated Creatinine Clearance: 62 mL/min (by C-G formula based on SCr of 0.81 mg/dL).   Assessment: Anticoag: none PTA. Heparin for ACS/chest pain. +troponins. Severe multivessel disease on cath. Hgb 8.3>8.8. Plts 317ok. Heparin resumed post-cath. HL 0.38 in goal. Continues to have CP. - 10/25 s/p transfusion, plt wnl   Goal of Therapy:  Heparin level 0.3-0.7 units/ml Monitor platelets by anticoagulation protocol: Yes   Plan:  Continue IV heparin at 900 units/hr Daily HL, CBC CABG 10/31   Devon Kingdon S. Merilynn Finland, PharmD, BCPS Clinical Staff Pharmacist Pasty Spillers 04/23/2018,10:12 AM

## 2018-04-24 ENCOUNTER — Inpatient Hospital Stay (HOSPITAL_COMMUNITY): Payer: Medicaid Other

## 2018-04-24 ENCOUNTER — Inpatient Hospital Stay (HOSPITAL_COMMUNITY)
Admission: EM | Disposition: A | Discharge status: Home / Self Care | Payer: Self-pay | Source: Home / Self Care | Attending: Cardiothoracic Surgery

## 2018-04-24 ENCOUNTER — Inpatient Hospital Stay (HOSPITAL_COMMUNITY): Payer: Medicaid Other | Admitting: Certified Registered Nurse Anesthetist

## 2018-04-24 DIAGNOSIS — I25110 Atherosclerotic heart disease of native coronary artery with unstable angina pectoris: Secondary | ICD-10-CM

## 2018-04-24 DIAGNOSIS — Z951 Presence of aortocoronary bypass graft: Secondary | ICD-10-CM

## 2018-04-24 HISTORY — PX: TEE WITHOUT CARDIOVERSION: SHX5443

## 2018-04-24 HISTORY — PX: CORONARY ARTERY BYPASS GRAFT: SHX141

## 2018-04-24 LAB — POCT I-STAT, CHEM 8
BUN: 8 mg/dL (ref 6–20)
BUN: 7 mg/dL (ref 6–20)
BUN: 7 mg/dL (ref 6–20)
BUN: 7 mg/dL (ref 6–20)
BUN: 8 mg/dL (ref 6–20)
BUN: 8 mg/dL (ref 6–20)
CALCIUM ION: 1.05 mmol/L — AB (ref 1.15–1.40)
CHLORIDE: 106 mmol/L (ref 98–111)
CHLORIDE: 103 mmol/L (ref 98–111)
CREATININE: 0.6 mg/dL (ref 0.44–1.00)
CREATININE: 0.5 mg/dL (ref 0.44–1.00)
CREATININE: 0.6 mg/dL (ref 0.44–1.00)
Calcium, Ion: 1.25 mmol/L (ref 1.15–1.40)
Calcium, Ion: 1.04 mmol/L — ABNORMAL LOW (ref 1.15–1.40)
Calcium, Ion: 1.18 mmol/L (ref 1.15–1.40)
Calcium, Ion: 1.35 mmol/L (ref 1.15–1.40)
Calcium, Ion: 1.18 mmol/L (ref 1.15–1.40)
Chloride: 104 mmol/L (ref 98–111)
Chloride: 102 mmol/L (ref 98–111)
Chloride: 106 mmol/L (ref 98–111)
Chloride: 107 mmol/L (ref 98–111)
Creatinine, Ser: 0.5 mg/dL (ref 0.44–1.00)
Creatinine, Ser: 0.4 mg/dL — ABNORMAL LOW (ref 0.44–1.00)
Creatinine, Ser: 0.5 mg/dL (ref 0.44–1.00)
GLUCOSE: 133 mg/dL — AB (ref 70–99)
GLUCOSE: 116 mg/dL — AB (ref 70–99)
GLUCOSE: 155 mg/dL — AB (ref 70–99)
Glucose, Bld: 82 mg/dL (ref 70–99)
Glucose, Bld: 103 mg/dL — ABNORMAL HIGH (ref 70–99)
Glucose, Bld: 118 mg/dL — ABNORMAL HIGH (ref 70–99)
HCT: 28 % — ABNORMAL LOW (ref 36.0–46.0)
HCT: 30 % — ABNORMAL LOW (ref 36.0–46.0)
HCT: 30 % — ABNORMAL LOW (ref 36.0–46.0)
HEMATOCRIT: 30 % — AB (ref 36.0–46.0)
HEMATOCRIT: 27 % — AB (ref 36.0–46.0)
HEMATOCRIT: 33 % — AB (ref 36.0–46.0)
HEMOGLOBIN: 10.2 g/dL — AB (ref 12.0–15.0)
HEMOGLOBIN: 9.2 g/dL — AB (ref 12.0–15.0)
HEMOGLOBIN: 9.5 g/dL — AB (ref 12.0–15.0)
HEMOGLOBIN: 10.2 g/dL — AB (ref 12.0–15.0)
HEMOGLOBIN: 11.2 g/dL — AB (ref 12.0–15.0)
Hemoglobin: 10.2 g/dL — ABNORMAL LOW (ref 12.0–15.0)
POTASSIUM: 4 mmol/L (ref 3.5–5.1)
POTASSIUM: 4 mmol/L (ref 3.5–5.1)
POTASSIUM: 4.3 mmol/L (ref 3.5–5.1)
Potassium: 4.6 mmol/L (ref 3.5–5.1)
Potassium: 6.3 mmol/L (ref 3.5–5.1)
Potassium: 4.3 mmol/L (ref 3.5–5.1)
SODIUM: 139 mmol/L (ref 135–145)
SODIUM: 138 mmol/L (ref 135–145)
Sodium: 140 mmol/L (ref 135–145)
Sodium: 140 mmol/L (ref 135–145)
Sodium: 134 mmol/L — ABNORMAL LOW (ref 135–145)
Sodium: 138 mmol/L (ref 135–145)
TCO2: 26 mmol/L (ref 22–32)
TCO2: 25 mmol/L (ref 22–32)
TCO2: 27 mmol/L (ref 22–32)
TCO2: 23 mmol/L (ref 22–32)
TCO2: 25 mmol/L (ref 22–32)
TCO2: 24 mmol/L (ref 22–32)

## 2018-04-24 LAB — CBC
HCT: 31.1 % — ABNORMAL LOW (ref 36.0–46.0)
HCT: 35.2 % — ABNORMAL LOW (ref 36.0–46.0)
HCT: 35.3 % — ABNORMAL LOW (ref 36.0–46.0)
Hemoglobin: 8.9 g/dL — ABNORMAL LOW (ref 12.0–15.0)
Hemoglobin: 10.4 g/dL — ABNORMAL LOW (ref 12.0–15.0)
Hemoglobin: 10.5 g/dL — ABNORMAL LOW (ref 12.0–15.0)
MCH: 23.9 pg — ABNORMAL LOW (ref 26.0–34.0)
MCH: 25.2 pg — AB (ref 26.0–34.0)
MCH: 25.4 pg — ABNORMAL LOW (ref 26.0–34.0)
MCHC: 28.6 g/dL — ABNORMAL LOW (ref 30.0–36.0)
MCHC: 29.5 g/dL — AB (ref 30.0–36.0)
MCHC: 29.7 g/dL — ABNORMAL LOW (ref 30.0–36.0)
MCV: 83.6 fL (ref 80.0–100.0)
MCV: 85.2 fL (ref 80.0–100.0)
MCV: 85.3 fL (ref 80.0–100.0)
PLATELETS: 140 10*3/uL — AB (ref 150–400)
Platelets: 289 10*3/uL (ref 150–400)
Platelets: 189 10*3/uL (ref 150–400)
RBC: 3.72 MIL/uL — ABNORMAL LOW (ref 3.87–5.11)
RBC: 4.13 MIL/uL (ref 3.87–5.11)
RBC: 4.14 MIL/uL (ref 3.87–5.11)
RDW: 16.8 % — ABNORMAL HIGH (ref 11.5–15.5)
RDW: 15.8 % — ABNORMAL HIGH (ref 11.5–15.5)
RDW: 16.3 % — ABNORMAL HIGH (ref 11.5–15.5)
WBC: 5.9 10*3/uL (ref 4.0–10.5)
WBC: 11.8 10*3/uL — AB (ref 4.0–10.5)
WBC: 10.8 10*3/uL — ABNORMAL HIGH (ref 4.0–10.5)
nRBC: 0.3 % — ABNORMAL HIGH (ref 0.0–0.2)
nRBC: 0.2 % (ref 0.0–0.2)
nRBC: 0 % (ref 0.0–0.2)

## 2018-04-24 LAB — POCT I-STAT 3, ART BLOOD GAS (G3+)
ACID-BASE EXCESS: 2 mmol/L (ref 0.0–2.0)
Acid-base deficit: 3 mmol/L — ABNORMAL HIGH (ref 0.0–2.0)
Acid-base deficit: 6 mmol/L — ABNORMAL HIGH (ref 0.0–2.0)
Acid-base deficit: 1 mmol/L (ref 0.0–2.0)
BICARBONATE: 26.3 mmol/L (ref 20.0–28.0)
BICARBONATE: 22.5 mmol/L (ref 20.0–28.0)
Bicarbonate: 23 mmol/L (ref 20.0–28.0)
Bicarbonate: 19.8 mmol/L — ABNORMAL LOW (ref 20.0–28.0)
Bicarbonate: 25.7 mmol/L (ref 20.0–28.0)
O2 SAT: 99 %
O2 SAT: 100 %
O2 Saturation: 100 %
O2 Saturation: 100 %
O2 Saturation: 100 %
PO2 ART: 165 mmHg — AB (ref 83.0–108.0)
TCO2: 28 mmol/L (ref 22–32)
TCO2: 24 mmol/L (ref 22–32)
TCO2: 21 mmol/L — AB (ref 22–32)
TCO2: 24 mmol/L (ref 22–32)
TCO2: 27 mmol/L (ref 22–32)
pCO2 arterial: 51.6 mmHg — ABNORMAL HIGH (ref 32.0–48.0)
pCO2 arterial: 43.3 mmHg (ref 32.0–48.0)
pCO2 arterial: 36.6 mmHg (ref 32.0–48.0)
pCO2 arterial: 33.4 mmHg (ref 32.0–48.0)
pCO2 arterial: 36.9 mmHg (ref 32.0–48.0)
pH, Arterial: 7.316 — ABNORMAL LOW (ref 7.350–7.450)
pH, Arterial: 7.333 — ABNORMAL LOW (ref 7.350–7.450)
pH, Arterial: 7.336 — ABNORMAL LOW (ref 7.350–7.450)
pH, Arterial: 7.436 (ref 7.350–7.450)
pH, Arterial: 7.45 (ref 7.350–7.450)
pO2, Arterial: 411 mmHg — ABNORMAL HIGH (ref 83.0–108.0)
pO2, Arterial: 390 mmHg — ABNORMAL HIGH (ref 83.0–108.0)
pO2, Arterial: 168 mmHg — ABNORMAL HIGH (ref 83.0–108.0)
pO2, Arterial: 164 mmHg — ABNORMAL HIGH (ref 83.0–108.0)

## 2018-04-24 LAB — BASIC METABOLIC PANEL
Anion gap: 7 (ref 5–15)
BUN: 7 mg/dL (ref 6–20)
CO2: 24 mmol/L (ref 22–32)
Calcium: 8.9 mg/dL (ref 8.9–10.3)
Chloride: 105 mmol/L (ref 98–111)
Creatinine, Ser: 0.69 mg/dL (ref 0.44–1.00)
GFR calc Af Amer: >60 mL/min (ref >60)
GFR calc non Af Amer: >60 mL/min (ref >60)
Glucose, Bld: 84 mg/dL (ref 70–99)
Potassium: 4 mmol/L (ref 3.5–5.1)
Sodium: 136 mmol/L (ref 135–145)

## 2018-04-24 LAB — PREPARE RBC (CROSSMATCH)

## 2018-04-24 LAB — PLATELET COUNT: Platelets: 184 K/uL (ref 150–400)

## 2018-04-24 LAB — CREATININE, SERUM
Creatinine, Ser: 0.74 mg/dL (ref 0.44–1.00)
GFR calc Af Amer: >60 mL/min (ref >60)
GFR calc non Af Amer: >60 mL/min (ref >60)

## 2018-04-24 LAB — HEMOGLOBIN AND HEMATOCRIT, BLOOD
HCT: 32.1 % — ABNORMAL LOW (ref 36.0–46.0)
Hemoglobin: 9.8 g/dL — ABNORMAL LOW (ref 12.0–15.0)

## 2018-04-24 LAB — GLUCOSE, CAPILLARY
GLUCOSE-CAPILLARY: 53 mg/dL — AB (ref 70–99)
GLUCOSE-CAPILLARY: 87 mg/dL (ref 70–99)
GLUCOSE-CAPILLARY: 118 mg/dL — AB (ref 70–99)
Glucose-Capillary: 114 mg/dL — ABNORMAL HIGH (ref 70–99)

## 2018-04-24 LAB — APTT: aPTT: 30 seconds (ref 24–36)

## 2018-04-24 LAB — PROTIME-INR
INR: 1.28
PROTHROMBIN TIME: 15.9 s — AB (ref 11.4–15.2)

## 2018-04-24 LAB — HEPARIN LEVEL (UNFRACTIONATED): HEPARIN UNFRACTIONATED: 0.3 [IU]/mL (ref 0.30–0.70)

## 2018-04-24 LAB — MAGNESIUM: Magnesium: 3 mg/dL — ABNORMAL HIGH (ref 1.7–2.4)

## 2018-04-24 SURGERY — CORONARY ARTERY BYPASS GRAFTING (CABG)
Anesthesia: General | Site: Chest

## 2018-04-24 MED ORDER — FAMOTIDINE IN NACL 20-0.9 MG/50ML-% IV SOLN
20.0000 mg | Freq: Two times a day (BID) | INTRAVENOUS | Status: AC
  Administered 2018-04-24 (×2): 20 mg via INTRAVENOUS
  Filled 2018-04-24 (×2): qty 50

## 2018-04-24 MED ORDER — ORAL CARE MOUTH RINSE
15.0000 mL | OROMUCOSAL | Status: DC
  Administered 2018-04-24: 15 mL via OROMUCOSAL

## 2018-04-24 MED ORDER — LIDOCAINE 2% (20 MG/ML) 5 ML SYRINGE
INTRAMUSCULAR | Status: AC
  Filled 2018-04-24: qty 5

## 2018-04-24 MED ORDER — BISACODYL 10 MG RE SUPP: 10.0000 mg | Freq: Every day | RECTAL | Status: DC

## 2018-04-24 MED ORDER — LACTATED RINGERS IV SOLN: 500.0000 mL | Freq: Once | INTRAVENOUS | Status: DC | PRN

## 2018-04-24 MED ORDER — PROPOFOL 10 MG/ML IV BOLUS
INTRAVENOUS | Status: AC
  Filled 2018-04-24: qty 20

## 2018-04-24 MED ORDER — METOCLOPRAMIDE HCL 5 MG/ML IJ SOLN
5.0000 mg | Freq: Four times a day (QID) | INTRAMUSCULAR | Status: DC
  Administered 2018-04-24 – 2018-04-26 (×8): 5 mg via INTRAVENOUS
  Filled 2018-04-24 (×8): qty 2

## 2018-04-24 MED ORDER — METOPROLOL TARTRATE 25 MG/10 ML ORAL SUSPENSION: 12.5000 mg | Freq: Two times a day (BID) | ORAL | Status: DC

## 2018-04-24 MED ORDER — MILRINONE LACTATE IN DEXTROSE 20-5 MG/100ML-% IV SOLN: 0.1250 ug/kg/min | INTRAVENOUS | Status: DC

## 2018-04-24 MED ORDER — DEXTROSE 50 % IV SOLN
INTRAVENOUS | Status: AC
  Administered 2018-04-24: 19 mL via INTRAVENOUS
  Filled 2018-04-24: qty 50

## 2018-04-24 MED ORDER — METOPROLOL TARTRATE 12.5 MG HALF TABLET
12.5000 mg | ORAL_TABLET | Freq: Two times a day (BID) | ORAL | Status: DC
  Administered 2018-04-25 – 2018-04-27 (×6): 12.5 mg via ORAL
  Filled 2018-04-24 (×7): qty 1

## 2018-04-24 MED ORDER — LACTATED RINGERS IV SOLN
INTRAVENOUS | Status: DC | PRN
  Administered 2018-04-24: 08:00:00 via INTRAVENOUS

## 2018-04-24 MED ORDER — SODIUM BICARBONATE 8.4 % IV SOLN
50.0000 meq | Freq: Once | INTRAVENOUS | Status: AC
  Administered 2018-04-24: 50 meq via INTRAVENOUS

## 2018-04-24 MED ORDER — 0.9 % SODIUM CHLORIDE (POUR BTL) OPTIME
TOPICAL | Status: DC | PRN
  Administered 2018-04-24: 6000 mL

## 2018-04-24 MED ORDER — MIDAZOLAM HCL 2 MG/2ML IJ SOLN: 2.0000 mg | INTRAMUSCULAR | Status: DC | PRN

## 2018-04-24 MED ORDER — DOCUSATE SODIUM 100 MG PO CAPS
200.0000 mg | ORAL_CAPSULE | Freq: Every day | ORAL | Status: DC
  Administered 2018-04-25 – 2018-04-26 (×2): 200 mg via ORAL
  Filled 2018-04-24 (×2): qty 2

## 2018-04-24 MED ORDER — LACTATED RINGERS IV SOLN
INTRAVENOUS | Status: DC
  Administered 2018-04-25: 01:00:00 via INTRAVENOUS

## 2018-04-24 MED ORDER — SODIUM CHLORIDE 0.9% FLUSH
10.0000 mL | Freq: Two times a day (BID) | INTRAVENOUS | Status: DC
  Administered 2018-04-24 – 2018-04-27 (×7): 10 mL
  Administered 2018-04-27: 40 mL

## 2018-04-24 MED ORDER — SODIUM CHLORIDE 0.9 % IV SOLN
1.5000 g | Freq: Two times a day (BID) | INTRAVENOUS | Status: AC
  Administered 2018-04-24 – 2018-04-26 (×4): 1.5 g via INTRAVENOUS
  Filled 2018-04-24 (×5): qty 1.5

## 2018-04-24 MED ORDER — PROPOFOL 10 MG/ML IV BOLUS
INTRAVENOUS | Status: DC | PRN
  Administered 2018-04-24: 50 mg via INTRAVENOUS
  Administered 2018-04-24: 10 mg via INTRAVENOUS

## 2018-04-24 MED ORDER — MORPHINE SULFATE (PF) 2 MG/ML IV SOLN
2.0000 mg | INTRAVENOUS | Status: DC | PRN
  Administered 2018-04-25: 4 mg via INTRAVENOUS
  Filled 2018-04-24: qty 2

## 2018-04-24 MED ORDER — ONDANSETRON HCL 4 MG/2ML IJ SOLN: 4.0000 mg | Freq: Four times a day (QID) | INTRAMUSCULAR | Status: DC | PRN

## 2018-04-24 MED ORDER — SODIUM CHLORIDE 0.9 % IV SOLN
INTRAVENOUS | Status: DC | PRN
  Administered 2018-04-24: 30 ug/min via INTRAVENOUS

## 2018-04-24 MED ORDER — ASPIRIN 81 MG PO CHEW: 324.0000 mg | CHEWABLE_TABLET | Freq: Every day | ORAL | Status: DC

## 2018-04-24 MED ORDER — HEPARIN SODIUM (PORCINE) 1000 UNIT/ML IJ SOLN
INTRAMUSCULAR | Status: AC
  Filled 2018-04-24: qty 1

## 2018-04-24 MED ORDER — PANTOPRAZOLE SODIUM 40 MG PO TBEC
40.0000 mg | DELAYED_RELEASE_TABLET | Freq: Every day | ORAL | Status: DC
  Administered 2018-04-26 – 2018-04-27 (×2): 40 mg via ORAL
  Filled 2018-04-24 (×2): qty 1

## 2018-04-24 MED ORDER — ACETAMINOPHEN 160 MG/5ML PO SOLN: 1000.0000 mg | Freq: Four times a day (QID) | ORAL | Status: DC

## 2018-04-24 MED ORDER — LIDOCAINE 2% (20 MG/ML) 5 ML SYRINGE
INTRAMUSCULAR | Status: DC | PRN
  Administered 2018-04-24: 100 mg via INTRAVENOUS

## 2018-04-24 MED ORDER — FENTANYL CITRATE (PF) 250 MCG/5ML IJ SOLN
INTRAMUSCULAR | Status: AC
  Filled 2018-04-24: qty 10

## 2018-04-24 MED ORDER — SODIUM CHLORIDE 0.45 % IV SOLN: INTRAVENOUS | Status: DC | PRN

## 2018-04-24 MED ORDER — ONDANSETRON HCL 4 MG/2ML IJ SOLN
INTRAMUSCULAR | Status: AC
  Filled 2018-04-24: qty 2

## 2018-04-24 MED ORDER — HEPARIN SODIUM (PORCINE) 1000 UNIT/ML IJ SOLN
INTRAMUSCULAR | Status: DC | PRN
  Administered 2018-04-24: 2000 [IU] via INTRAVENOUS
  Administered 2018-04-24: 17000 [IU] via INTRAVENOUS
  Administered 2018-04-24: 2000 [IU] via INTRAVENOUS

## 2018-04-24 MED ORDER — BISACODYL 5 MG PO TBEC
10.0000 mg | DELAYED_RELEASE_TABLET | Freq: Every day | ORAL | Status: DC
  Administered 2018-04-25 – 2018-04-26 (×2): 10 mg via ORAL
  Filled 2018-04-24 (×2): qty 2

## 2018-04-24 MED ORDER — ROCURONIUM BROMIDE 10 MG/ML (PF) SYRINGE
PREFILLED_SYRINGE | INTRAVENOUS | Status: DC | PRN
  Administered 2018-04-24: 100 mg via INTRAVENOUS
  Administered 2018-04-24: 20 mg via INTRAVENOUS
  Administered 2018-04-24: 50 mg via INTRAVENOUS
  Administered 2018-04-24: 30 mg via INTRAVENOUS

## 2018-04-24 MED ORDER — ROCURONIUM BROMIDE 50 MG/5ML IV SOSY
PREFILLED_SYRINGE | INTRAVENOUS | Status: AC
  Filled 2018-04-24: qty 10

## 2018-04-24 MED ORDER — MORPHINE SULFATE (PF) 2 MG/ML IV SOLN
1.0000 mg | INTRAVENOUS | Status: DC | PRN
  Administered 2018-04-24: 4 mg via INTRAVENOUS
  Filled 2018-04-24: qty 2

## 2018-04-24 MED ORDER — CHLORHEXIDINE GLUCONATE CLOTH 2 % EX PADS
6.0000 | MEDICATED_PAD | Freq: Every day | CUTANEOUS | Status: DC
  Administered 2018-04-24 – 2018-04-26 (×2): 6 via TOPICAL

## 2018-04-24 MED ORDER — SODIUM CHLORIDE 0.9 % IV SOLN: 250.0000 mL | INTRAVENOUS | Status: DC

## 2018-04-24 MED ORDER — SODIUM CHLORIDE 0.9 % IV SOLN
INTRAVENOUS | Status: DC
  Administered 2018-04-24: 14:00:00 via INTRAVENOUS

## 2018-04-24 MED ORDER — POTASSIUM CHLORIDE 10 MEQ/50ML IV SOLN: 10.0000 meq | INTRAVENOUS | Status: AC

## 2018-04-24 MED ORDER — PHENYLEPHRINE 40 MCG/ML (10ML) SYRINGE FOR IV PUSH (FOR BLOOD PRESSURE SUPPORT)
PREFILLED_SYRINGE | INTRAVENOUS | Status: DC | PRN
  Administered 2018-04-24 (×2): 80 ug via INTRAVENOUS
  Administered 2018-04-24: 40 ug via INTRAVENOUS
  Administered 2018-04-24 (×7): 80 ug via INTRAVENOUS
  Administered 2018-04-24: 40 ug via INTRAVENOUS

## 2018-04-24 MED ORDER — OXYCODONE HCL 5 MG PO TABS
5.0000 mg | ORAL_TABLET | ORAL | Status: DC | PRN
  Administered 2018-04-25 (×2): 5 mg via ORAL
  Administered 2018-04-25 – 2018-04-28 (×5): 10 mg via ORAL
  Filled 2018-04-24 (×3): qty 2
  Filled 2018-04-24 (×2): qty 1
  Filled 2018-04-24 (×3): qty 2

## 2018-04-24 MED ORDER — LACTATED RINGERS IV SOLN: INTRAVENOUS | Status: DC

## 2018-04-24 MED ORDER — ASPIRIN EC 325 MG PO TBEC
325.0000 mg | DELAYED_RELEASE_TABLET | Freq: Every day | ORAL | Status: DC
  Administered 2018-04-25 – 2018-04-27 (×3): 325 mg via ORAL
  Filled 2018-04-24 (×3): qty 1

## 2018-04-24 MED ORDER — NITROGLYCERIN IN D5W 200-5 MCG/ML-% IV SOLN: 0.0000 ug/min | INTRAVENOUS | Status: DC

## 2018-04-24 MED ORDER — ALBUMIN HUMAN 5 % IV SOLN
250.0000 mL | INTRAVENOUS | Status: AC | PRN
  Administered 2018-04-24 – 2018-04-25 (×3): 12.5 g via INTRAVENOUS
  Filled 2018-04-24: qty 250

## 2018-04-24 MED ORDER — METOPROLOL TARTRATE 5 MG/5ML IV SOLN: 2.5000 mg | INTRAVENOUS | Status: DC | PRN

## 2018-04-24 MED ORDER — MIDAZOLAM HCL 10 MG/2ML IJ SOLN
INTRAMUSCULAR | Status: AC
  Filled 2018-04-24: qty 2

## 2018-04-24 MED ORDER — ROCURONIUM BROMIDE 50 MG/5ML IV SOSY
PREFILLED_SYRINGE | INTRAVENOUS | Status: AC
  Filled 2018-04-24: qty 5

## 2018-04-24 MED ORDER — MIDAZOLAM HCL 5 MG/5ML IJ SOLN
INTRAMUSCULAR | Status: DC | PRN
  Administered 2018-04-24: 3 mg via INTRAVENOUS
  Administered 2018-04-24: 2 mg via INTRAVENOUS
  Administered 2018-04-24: 5 mg via INTRAVENOUS

## 2018-04-24 MED ORDER — PROTAMINE SULFATE 10 MG/ML IV SOLN
INTRAVENOUS | Status: DC | PRN
  Administered 2018-04-24: 20 mg via INTRAVENOUS
  Administered 2018-04-24: 190 mg via INTRAVENOUS

## 2018-04-24 MED ORDER — INSULIN ASPART 100 UNIT/ML ~~LOC~~ SOLN: 0.0000 [IU] | SUBCUTANEOUS | Status: DC

## 2018-04-24 MED ORDER — ORAL CARE MOUTH RINSE
15.0000 mL | Freq: Two times a day (BID) | OROMUCOSAL | Status: DC
  Administered 2018-04-25 – 2018-04-27 (×4): 15 mL via OROMUCOSAL

## 2018-04-24 MED ORDER — DEXTROSE 50 % IV SOLN
19.0000 mL | Freq: Once | INTRAVENOUS | Status: AC
  Administered 2018-04-24: 19 mL via INTRAVENOUS

## 2018-04-24 MED ORDER — INSULIN REGULAR(HUMAN) IN NACL 100-0.9 UT/100ML-% IV SOLN: INTRAVENOUS | Status: DC

## 2018-04-24 MED ORDER — ACETAMINOPHEN 500 MG PO TABS
1000.0000 mg | ORAL_TABLET | Freq: Four times a day (QID) | ORAL | Status: DC
  Administered 2018-04-24 – 2018-04-28 (×13): 1000 mg via ORAL
  Filled 2018-04-24 (×14): qty 2

## 2018-04-24 MED ORDER — DEXMEDETOMIDINE HCL IN NACL 400 MCG/100ML IV SOLN
INTRAVENOUS | Status: DC | PRN
  Administered 2018-04-24: .5 ug/kg/h via INTRAVENOUS

## 2018-04-24 MED ORDER — CHLORHEXIDINE GLUCONATE 0.12 % MT SOLN
15.0000 mL | OROMUCOSAL | Status: AC
  Administered 2018-04-24: 15 mL via OROMUCOSAL

## 2018-04-24 MED ORDER — HEMOSTATIC AGENTS (NO CHARGE) OPTIME
TOPICAL | Status: DC | PRN
  Administered 2018-04-24: 1 via TOPICAL
  Administered 2018-04-24: 2 via TOPICAL
  Administered 2018-04-24: 1 via TOPICAL

## 2018-04-24 MED ORDER — DEXAMETHASONE SODIUM PHOSPHATE 10 MG/ML IJ SOLN
INTRAMUSCULAR | Status: AC
  Filled 2018-04-24: qty 1

## 2018-04-24 MED ORDER — METOPROLOL TARTRATE 5 MG/5ML IV SOLN
INTRAVENOUS | Status: DC | PRN
  Administered 2018-04-24 (×2): 1 mg via INTRAVENOUS

## 2018-04-24 MED ORDER — SODIUM CHLORIDE 0.9 % IV SOLN
INTRAVENOUS | Status: DC | PRN
  Administered 2018-04-24: .5 [IU]/h via INTRAVENOUS

## 2018-04-24 MED ORDER — ACETAMINOPHEN 650 MG RE SUPP
650.0000 mg | Freq: Once | RECTAL | Status: AC
  Administered 2018-04-24: 650 mg via RECTAL

## 2018-04-24 MED ORDER — MAGNESIUM SULFATE 4 GM/100ML IV SOLN
4.0000 g | Freq: Once | INTRAVENOUS | Status: AC
  Administered 2018-04-24: 4 g via INTRAVENOUS
  Filled 2018-04-24: qty 100

## 2018-04-24 MED ORDER — INSULIN REGULAR BOLUS VIA INFUSION
0.0000 [IU] | Freq: Three times a day (TID) | INTRAVENOUS | Status: DC
  Filled 2018-04-24: qty 10

## 2018-04-24 MED ORDER — ACETAMINOPHEN 160 MG/5ML PO SOLN: 650.0000 mg | Freq: Once | ORAL | Status: AC

## 2018-04-24 MED ORDER — TRAMADOL HCL 50 MG PO TABS
50.0000 mg | ORAL_TABLET | ORAL | Status: DC | PRN
  Administered 2018-04-25 – 2018-04-26 (×2): 100 mg via ORAL
  Filled 2018-04-24 (×2): qty 2

## 2018-04-24 MED ORDER — DEXMEDETOMIDINE HCL IN NACL 400 MCG/100ML IV SOLN: 0.0000 ug/kg/h | INTRAVENOUS | Status: DC

## 2018-04-24 MED ORDER — CHLORHEXIDINE GLUCONATE 0.12% ORAL RINSE (MEDLINE KIT)
15.0000 mL | Freq: Two times a day (BID) | OROMUCOSAL | Status: DC
  Administered 2018-04-24: 15 mL via OROMUCOSAL

## 2018-04-24 MED ORDER — PROTAMINE SULFATE 10 MG/ML IV SOLN
INTRAVENOUS | Status: AC
  Filled 2018-04-24: qty 25

## 2018-04-24 MED ORDER — TRANEXAMIC ACID 1000 MG/10ML IV SOLN
INTRAVENOUS | Status: DC | PRN
  Administered 2018-04-24: 1.5 mg/kg/h via INTRAVENOUS

## 2018-04-24 MED ORDER — VECURONIUM BROMIDE 10 MG IV SOLR
INTRAVENOUS | Status: DC | PRN
  Administered 2018-04-24: 10 mg via INTRAVENOUS

## 2018-04-24 MED ORDER — NOREPINEPHRINE 4 MG/250ML-% IV SOLN
0.0000 ug/min | INTRAVENOUS | Status: DC
  Filled 2018-04-24: qty 250

## 2018-04-24 MED ORDER — SODIUM CHLORIDE 0.9% FLUSH: 3.0000 mL | INTRAVENOUS | Status: DC | PRN

## 2018-04-24 MED ORDER — FENTANYL CITRATE (PF) 250 MCG/5ML IJ SOLN
INTRAMUSCULAR | Status: DC | PRN
  Administered 2018-04-24 (×2): 50 ug via INTRAVENOUS
  Administered 2018-04-24: 300 ug via INTRAVENOUS
  Administered 2018-04-24 (×2): 150 ug via INTRAVENOUS
  Administered 2018-04-24: 100 ug via INTRAVENOUS

## 2018-04-24 MED ORDER — SODIUM CHLORIDE 0.9% FLUSH: 3.0000 mL | Freq: Two times a day (BID) | INTRAVENOUS | Status: DC

## 2018-04-24 MED ORDER — PHENYLEPHRINE HCL-NACL 20-0.9 MG/250ML-% IV SOLN
0.0000 ug/min | INTRAVENOUS | Status: DC
  Filled 2018-04-24: qty 250

## 2018-04-24 MED ORDER — SODIUM CHLORIDE 0.9% FLUSH: 10.0000 mL | INTRAVENOUS | Status: DC | PRN

## 2018-04-24 MED ORDER — VANCOMYCIN HCL IN DEXTROSE 1-5 GM/200ML-% IV SOLN
1000.0000 mg | Freq: Once | INTRAVENOUS | Status: AC
  Administered 2018-04-24: 1000 mg via INTRAVENOUS
  Filled 2018-04-24: qty 200

## 2018-04-24 SURGICAL SUPPLY — 118 items
ADAPTER CARDIO PERF ANTE/RETRO (ADAPTER) ×4 IMPLANT
BAG DECANTER FOR FLEXI CONT (MISCELLANEOUS) ×4 IMPLANT
BANDAGE ACE 4X5 VEL STRL LF (GAUZE/BANDAGES/DRESSINGS) ×4 IMPLANT
BANDAGE ACE 6X5 VEL STRL LF (GAUZE/BANDAGES/DRESSINGS) ×4 IMPLANT
BANDAGE ELASTIC 4 VELCRO ST LF (GAUZE/BANDAGES/DRESSINGS) ×4 IMPLANT
BANDAGE ELASTIC 6 VELCRO ST LF (GAUZE/BANDAGES/DRESSINGS) ×4 IMPLANT
BASKET HEART  (ORDER IN 25'S) (MISCELLANEOUS) ×1
BASKET HEART (ORDER IN 25'S) (MISCELLANEOUS) ×1
BASKET HEART (ORDER IN 25S) (MISCELLANEOUS) ×2 IMPLANT
BLADE CLIPPER SURG (BLADE) IMPLANT
BLADE MINI RND TIP GREEN BEAV (BLADE) ×4 IMPLANT
BLADE NEEDLE 3 SS STRL (BLADE) ×3 IMPLANT
BLADE NEEDLE 3MM SS STRL (BLADE) ×1
BLADE STERNUM SYSTEM 6 (BLADE) ×4 IMPLANT
BLADE SURG 10 STRL SS (BLADE) ×4 IMPLANT
BLADE SURG 11 STRL SS (BLADE) ×4 IMPLANT
BLADE SURG 12 STRL SS (BLADE) ×4 IMPLANT
BLADE SURG 15 STRL LF DISP TIS (BLADE) ×2 IMPLANT
BLADE SURG 15 STRL SS (BLADE) ×2
BNDG GAUZE ELAST 4 BULKY (GAUZE/BANDAGES/DRESSINGS) ×4 IMPLANT
CANISTER SUCT 3000ML PPV (MISCELLANEOUS) ×4 IMPLANT
CANNULA GUNDRY RCSP 15FR (MISCELLANEOUS) ×4 IMPLANT
CANNULA MALLEABLE SINGLE 40FR (CANNULA) ×4 IMPLANT
CANNULA MC2 2 STG 32/40 NON-V (CANNULA) ×2 IMPLANT
CANNULA VENOUS 2 STG 32/40 (CANNULA) ×2
CATH CPB KIT VANTRIGT (MISCELLANEOUS) ×4 IMPLANT
CATH ROBINSON RED A/P 18FR (CATHETERS) ×12 IMPLANT
CATH THORACIC 36FR RT ANG (CATHETERS) ×4 IMPLANT
CLIP VESOCCLUDE SM WIDE 24/CT (CLIP) ×12 IMPLANT
CONN 3/8X1/2 ST GISH (MISCELLANEOUS) ×4 IMPLANT
COVER WAND RF STERILE (DRAPES) IMPLANT
CRADLE DONUT ADULT HEAD (MISCELLANEOUS) ×4 IMPLANT
DERMABOND ADVANCED (GAUZE/BANDAGES/DRESSINGS) ×2
DERMABOND ADVANCED .7 DNX12 (GAUZE/BANDAGES/DRESSINGS) ×2 IMPLANT
DRAIN CHANNEL 32F RND 10.7 FF (WOUND CARE) ×4 IMPLANT
DRAPE CARDIOVASCULAR INCISE (DRAPES) ×2
DRAPE SLUSH/WARMER DISC (DRAPES) ×4 IMPLANT
DRAPE SRG 135X102X78XABS (DRAPES) ×2 IMPLANT
DRSG AQUACEL AG ADV 3.5X14 (GAUZE/BANDAGES/DRESSINGS) ×4 IMPLANT
ELECT BLADE 4.0 EZ CLEAN MEGAD (MISCELLANEOUS) ×4
ELECT BLADE 6.5 EXT (BLADE) IMPLANT
ELECT CAUTERY BLADE 6.4 (BLADE) ×4 IMPLANT
ELECT REM PT RETURN 9FT ADLT (ELECTROSURGICAL) ×8
ELECTRODE BLDE 4.0 EZ CLN MEGD (MISCELLANEOUS) ×2 IMPLANT
ELECTRODE REM PT RTRN 9FT ADLT (ELECTROSURGICAL) ×4 IMPLANT
FELT TEFLON 1X6 (MISCELLANEOUS) ×8 IMPLANT
GAUZE SPONGE 4X4 12PLY STRL (GAUZE/BANDAGES/DRESSINGS) ×8 IMPLANT
GAUZE SPONGE 4X4 12PLY STRL LF (GAUZE/BANDAGES/DRESSINGS) ×8 IMPLANT
GLOVE BIO SURGEON STRL SZ 6.5 (GLOVE) ×24 IMPLANT
GLOVE BIO SURGEON STRL SZ7.5 (GLOVE) ×16 IMPLANT
GLOVE BIO SURGEONS STRL SZ 6.5 (GLOVE) ×8
GLOVE BIOGEL PI IND STRL 6 (GLOVE) ×8 IMPLANT
GLOVE BIOGEL PI IND STRL 6.5 (GLOVE) ×12 IMPLANT
GLOVE BIOGEL PI INDICATOR 6 (GLOVE) ×8
GLOVE BIOGEL PI INDICATOR 6.5 (GLOVE) ×12
GLOVE INDICATOR 7.5 STRL GRN (GLOVE) ×8 IMPLANT
GOWN STRL REUS W/ TWL LRG LVL3 (GOWN DISPOSABLE) ×20 IMPLANT
GOWN STRL REUS W/TWL LRG LVL3 (GOWN DISPOSABLE) ×20
HEMOSTAT POWDER SURGIFOAM 1G (HEMOSTASIS) IMPLANT
HEMOSTAT SURGICEL 2X14 (HEMOSTASIS) ×4 IMPLANT
INSERT FOGARTY XLG (MISCELLANEOUS) IMPLANT
KIT BASIN OR (CUSTOM PROCEDURE TRAY) ×4 IMPLANT
KIT SUCTION CATH 14FR (SUCTIONS) ×4 IMPLANT
KIT TURNOVER KIT B (KITS) ×4 IMPLANT
KIT VASOVIEW HEMOPRO 2 VH 4000 (KITS) ×4 IMPLANT
LEAD PACING MYOCARDI (MISCELLANEOUS) ×4 IMPLANT
MARKER GRAFT CORONARY BYPASS (MISCELLANEOUS) ×12 IMPLANT
NS IRRIG 1000ML POUR BTL (IV SOLUTION) ×24 IMPLANT
PACK E OPEN HEART (SUTURE) ×4 IMPLANT
PACK OPEN HEART (CUSTOM PROCEDURE TRAY) ×4 IMPLANT
PAD ARMBOARD 7.5X6 YLW CONV (MISCELLANEOUS) ×8 IMPLANT
PAD ELECT DEFIB RADIOL ZOLL (MISCELLANEOUS) ×4 IMPLANT
PENCIL BUTTON HOLSTER BLD 10FT (ELECTRODE) ×4 IMPLANT
PUNCH AORTIC ROTATE  4.5MM 8IN (MISCELLANEOUS) ×4 IMPLANT
PUNCH AORTIC ROTATE 4.0MM (MISCELLANEOUS) IMPLANT
PUNCH AORTIC ROTATE 4.5MM 8IN (MISCELLANEOUS) IMPLANT
PUNCH AORTIC ROTATE 5MM 8IN (MISCELLANEOUS) IMPLANT
SET CARDIOPLEGIA MPS 5001102 (MISCELLANEOUS) ×4 IMPLANT
SPONGE LAP 18X18 RF (DISPOSABLE) ×4 IMPLANT
SPONGE LAP 18X18 X RAY DECT (DISPOSABLE) ×4 IMPLANT
SPONGE LAP 4X18 RFD (DISPOSABLE) ×4 IMPLANT
STOPCOCK 4 WAY LG BORE MALE ST (IV SETS) ×4 IMPLANT
SURGIFLO W/THROMBIN 8M KIT (HEMOSTASIS) ×4 IMPLANT
SUT BONE WAX W31G (SUTURE) ×4 IMPLANT
SUT MNCRL AB 4-0 PS2 18 (SUTURE) IMPLANT
SUT PROLENE 3 0 SH DA (SUTURE) IMPLANT
SUT PROLENE 3 0 SH1 36 (SUTURE) IMPLANT
SUT PROLENE 4 0 RB 1 (SUTURE) ×2
SUT PROLENE 4 0 SH DA (SUTURE) ×4 IMPLANT
SUT PROLENE 4-0 RB1 .5 CRCL 36 (SUTURE) ×2 IMPLANT
SUT PROLENE 5 0 C 1 36 (SUTURE) IMPLANT
SUT PROLENE 6 0 C 1 30 (SUTURE) IMPLANT
SUT PROLENE 6 0 CC (SUTURE) ×8 IMPLANT
SUT PROLENE 8 0 BV175 6 (SUTURE) ×8 IMPLANT
SUT PROLENE BLUE 7 0 (SUTURE) ×8 IMPLANT
SUT SILK  1 MH (SUTURE)
SUT SILK 1 MH (SUTURE) IMPLANT
SUT SILK 2 0 SH CR/8 (SUTURE) ×4 IMPLANT
SUT SILK 3 0 SH CR/8 (SUTURE) IMPLANT
SUT STEEL 6MS V (SUTURE) ×12 IMPLANT
SUT STEEL SZ 6 DBL 3X14 BALL (SUTURE) ×4 IMPLANT
SUT VIC AB 1 CTX 36 (SUTURE) ×4
SUT VIC AB 1 CTX36XBRD ANBCTR (SUTURE) ×4 IMPLANT
SUT VIC AB 2-0 CT1 27 (SUTURE) ×2
SUT VIC AB 2-0 CT1 TAPERPNT 27 (SUTURE) ×2 IMPLANT
SUT VIC AB 2-0 CTX 27 (SUTURE) IMPLANT
SUT VIC AB 3-0 X1 27 (SUTURE) ×4 IMPLANT
SYSTEM SAHARA CHEST DRAIN ATS (WOUND CARE) ×4 IMPLANT
TAPE CLOTH SURG 4X10 WHT LF (GAUZE/BANDAGES/DRESSINGS) ×4 IMPLANT
TAPE PAPER 2X10 WHT MICROPORE (GAUZE/BANDAGES/DRESSINGS) ×4 IMPLANT
TOWEL GREEN STERILE (TOWEL DISPOSABLE) ×4 IMPLANT
TOWEL GREEN STERILE FF (TOWEL DISPOSABLE) ×4 IMPLANT
TRAY CATH LUMEN 1 20CM STRL (SET/KITS/TRAYS/PACK) ×4 IMPLANT
TRAY FOLEY SLVR 16FR TEMP STAT (SET/KITS/TRAYS/PACK) ×4 IMPLANT
TUBING ART PRESS 48 MALE/FEM (TUBING) ×8 IMPLANT
TUBING INSUFFLATION (TUBING) ×4 IMPLANT
UNDERPAD 30X30 (UNDERPADS AND DIAPERS) ×4 IMPLANT
WATER STERILE IRR 1000ML POUR (IV SOLUTION) ×8 IMPLANT

## 2018-04-24 NOTE — Anesthesia Procedure Notes (Signed)
Central Venous Catheter Insertion Performed by: Roderic Palau, MD, anesthesiologist Start/End10/31/2019 6:25 AM, 04/24/2018 6:40 AM Patient location: Pre-op. Preanesthetic checklist: patient identified, IV checked, site marked, risks and benefits discussed, surgical consent, monitors and equipment checked, pre-op evaluation, timeout performed and anesthesia consent Position: Trendelenburg Lidocaine 1% used for infiltration and patient sedated Hand hygiene performed , maximum sterile barriers used  and Seldinger technique used Catheter size: 9 Fr Total catheter length 10. Central line was placed.MAC introducer Procedure performed using ultrasound guided technique. Ultrasound Notes:anatomy identified, needle tip was noted to be adjacent to the nerve/plexus identified, no ultrasound evidence of intravascular and/or intraneural injection and image(s) printed for medical record Attempts: 1 Following insertion, line sutured, dressing applied and Biopatch. Post procedure assessment: blood return through all ports, free fluid flow and no air  Patient tolerated the procedure well with no immediate complications.

## 2018-04-24 NOTE — Anesthesia Procedure Notes (Addendum)
Arterial Line Insertion Start/End7/22/2019 7:36 AM, 01/18/2018 7:36 AM Performed by: Lewie Loron, MD, anesthesiologist  Preanesthetic checklist: patient identified, IV checked, site marked, risks and benefits discussed, surgical consent, monitors and equipment checked, pre-op evaluation, timeout performed and anesthesia consent Lidocaine 1% used for infiltration and patient sedated Left, radial was placed Catheter size: 20 G Hand hygiene performed  and Seldinger technique used Allen's test indicative of satisfactory collateral circulation Attempts: 5 or more (Final attempt by [place] with U/S) Procedure performed using ultrasound guided technique. Ultrasound Notes:anatomy identified, needle tip was noted to be adjacent to the nerve/plexus identified, no ultrasound evidence of intravascular and/or intraneural injection and image(s) printed for medical record Following insertion, dressing applied and Biopatch. Post procedure assessment: normal  Post procedure complications: local hematoma. Patient tolerated the procedure with difficulty.

## 2018-04-24 NOTE — Progress Notes (Signed)
Rt preformed mechanics for extubation per cardiac wean protocol.  Pt able to reach 450 mL vital capacity. Pt only able to reach - 10 NIF with 4 attempts.  Dr. Dorris Fetch stated pt is not ready to extubate.  Rt placed pt back on SIMV/PS/PRVC original settings.

## 2018-04-24 NOTE — Progress Notes (Signed)
      [address]       [name] [address]             [phone]      S/p CABG x 3 Intubated, but waking up and weaning BP 93/63   Pulse 80   Temp 97.7 F (36.5 C)   Resp 16   Ht 4\' 6"  (1.372 m)   Wt 71.2 kg   SpO2 100%   BMI 37.85 kg/m   Intake/Output Summary (Last 24 hours) at 04/24/2018 1834 Last data filed at 04/24/2018 1700 Gross per 24 hour  Intake 3580.44 ml  Output 3087 ml  Net 493.44 ml   CI= 2.5  Doing well early postop  Viviann Spare C. Dorris Fetch, MD [facility] [phone]

## 2018-04-24 NOTE — Procedures (Signed)
Extubation Procedure Note  Patient Details:   Name: [name] DOB: [birth year] MRN: [id number]   Airway Documentation:    Vent end date: 04/24/18 Vent end time: 2121   Evaluation  O2 sats: stable throughout Complications: No apparent complications Patient did tolerate procedure well. Bilateral Breath Sounds: Clear, Diminished  Able to speak post extubation: Yes   Extubation procedure clearly explained to the patient. Pt nodded her head for understanding. Patient was able to hold her head off the pillow for >5sec. Pt was able to follow commands without complications. Pt was extubated to a 4L [place] with bubble humidity provided. Pt is hemodynamically stable at this time.   Benjamine Sprague, B.S, RRT, RCP 04/24/2018, 9:26 PM

## 2018-04-24 NOTE — Care Management Note (Addendum)
Case Management Note  Patient Details  Name: [name] MRN: [id number] Date of Birth: 1966/02/03  Subjective/Objective: 52yo female presented with chest pain.                  Action/Plan: Patient POD#0 CABG and remains on vent support. Patient has no PCP/health insurance. CM to follow to meet with patient to discuss dispositional needs and arrangement of services.  Expected Discharge Date:                  Expected Discharge Plan:  Home/Self Care  In-House Referral:  NA  Discharge planning Services  CM Consult, Medication Assistance, Indigent Health Clinic  Post Acute Care Choice:  NA Choice offered to:  NA  DME Arranged:  N/A DME Agency:  NA  HH Arranged:  NA HH Agency:  NA  Status of Service:  In process, will continue to follow  If discussed at Long Length of Stay Meetings, dates discussed:    Additional Comments:  Colleen Can RN, BSN, NCM-BC, ACM-RN [phone] 04/24/2018, 2:16 PM

## 2018-04-24 NOTE — Progress Notes (Signed)
Patient passed all Cardiac weaning criteria. Patient passed pulmonary mechanics. Patient has an audible cuff leak.   NIF: -32 FVC: 1.02L

## 2018-04-24 NOTE — Transfer of Care (Signed)
Immediate Anesthesia Transfer of Care Note  Patient: [name]  Procedure(s) Performed: CORONARY ARTERY BYPASS GRAFTING (CABG) x3  using left internal mammary artery and right greater saphenous vein harvested endoscopically. (N/A Chest) TRANSESOPHAGEAL ECHOCARDIOGRAM (TEE) (N/A )  Patient Location: ICU  Anesthesia Type:General  Level of Consciousness: sedated and Patient remains intubated per anesthesia plan  Airway & Oxygen Therapy: Patient remains intubated per anesthesia plan  Post-op Assessment: Report given to RN and Post -op Vital signs reviewed and stable  Post vital signs: Reviewed and stable  Last Vitals:  Vitals Value Taken Time  BP    Temp    Pulse    Resp    SpO2      Last Pain:  Vitals:   04/24/18 0502  TempSrc: Oral  PainSc:          Complications: No apparent anesthesia complications

## 2018-04-24 NOTE — Progress Notes (Signed)
Pre Procedure note for inpatients:   [name] has been scheduled for Procedure(s): CORONARY ARTERY BYPASS GRAFTING (CABG) (N/A) TRANSESOPHAGEAL ECHOCARDIOGRAM (TEE) (N/A) today. The various methods of treatment have been discussed with the patient. After consideration of the risks, benefits and treatment options the patient has consented to the planned procedure.   The patient has been seen and labs reviewed. There are no changes in the patient's condition to prevent proceeding with the planned procedure today.  Recent labs:  Lab Results  Component Value Date   WBC 5.9 04/24/2018   HGB 8.9 (L) 04/24/2018   HCT 31.1 (L) 04/24/2018   PLT 289 04/24/2018   GLUCOSE 84 04/24/2018   CHOL 194 04/19/2018   TRIG 57 04/19/2018   HDL 51 04/19/2018   LDLCALC 132 (H) 04/19/2018   ALT 21 04/21/2018   AST 23 04/21/2018   NA 136 04/24/2018   K 4.0 04/24/2018   CL 105 04/24/2018   CREATININE 0.69 04/24/2018   BUN 7 04/24/2018   CO2 24 04/24/2018   TSH 1.300 04/23/2018   INR 1.08 04/23/2018   HGBA1C 5.9 (H) 04/18/2018    Mikey Bussing, MD 04/24/2018 7:36 AM

## 2018-04-24 NOTE — Progress Notes (Signed)
Pt is on PSV at this time. Tolerating it well.

## 2018-04-24 NOTE — Anesthesia Procedure Notes (Signed)
Procedure Name: Intubation Performed by: Milford Cage, CRNA Pre-anesthesia Checklist: Patient identified, Emergency Drugs available, Suction available and Patient being monitored Patient Re-evaluated:Patient Re-evaluated prior to induction Oxygen Delivery Method: Circle System Utilized Preoxygenation: Pre-oxygenation with 100% oxygen Induction Type: IV induction Ventilation: Mask ventilation without difficulty Laryngoscope Size: Mac and 3 Grade View: Grade I Tube type: Oral Tube size: 7.5 mm Number of attempts: 1 Airway Equipment and Method: Stylet Placement Confirmation: ETT inserted through vocal cords under direct vision,  positive ETCO2 and breath sounds checked- equal and bilateral Secured at: 21 cm Tube secured with: Tape Dental Injury: Teeth and Oropharynx as per pre-operative assessment

## 2018-04-24 NOTE — Anesthesia Procedure Notes (Signed)
Arterial Line Insertion Start/End10/31/2019 1:20 PM, 04/24/2018 1:30 PM Performed by: Lewie Loron, MD, anesthesiologist  Patient location: OR. Preanesthetic checklist: patient identified, IV checked, site marked, risks and benefits discussed, surgical consent, monitors and equipment checked, pre-op evaluation, timeout performed and anesthesia consent Patient sedated Right, radial was placed Catheter size: 20 G Hand hygiene performed  and maximum sterile barriers used  Allen's test indicative of satisfactory collateral circulation Attempts: 1 Procedure performed using ultrasound guided technique. Ultrasound Notes:anatomy identified, needle tip was noted to be adjacent to the nerve/plexus identified, no ultrasound evidence of intravascular and/or intraneural injection and image(s) printed for medical record Following insertion, Biopatch and dressing applied. Post procedure assessment: normal  Patient tolerated the procedure well with no immediate complications.

## 2018-04-24 NOTE — Anesthesia Procedure Notes (Signed)
Central Venous Catheter Insertion Performed by: Gaynelle Adu, MD, anesthesiologist Start/End10/31/2019 6:25 AM, 04/24/2018 6:40 AM Patient location: Pre-op. Preanesthetic checklist: patient identified, IV checked, site marked, risks and benefits discussed, surgical consent, monitors and equipment checked, pre-op evaluation, timeout performed and anesthesia consent Hand hygiene performed  and maximum sterile barriers used  PA cath was placed.Swan type:thermodilution PA Cath depth:45 Procedure performed without using ultrasound guided technique. Attempts: 1 Patient tolerated the procedure well with no immediate complications.

## 2018-04-24 NOTE — Brief Op Note (Signed)
04/18/2018 - 04/24/2018  2:21 PM  PATIENT:  [name]  52 y.o. female  PRE-OPERATIVE DIAGNOSIS:  CAD  POST-OPERATIVE DIAGNOSIS:  CAD  PROCEDURE:  Procedure(s): CORONARY ARTERY BYPASS GRAFTING (CABG) x3  using left internal mammary artery and right greater saphenous vein harvested endoscopically. (N/A) TRANSESOPHAGEAL ECHOCARDIOGRAM (TEE) (N/A) LIMA-LAD SVG-OM SVG-RCA  SURGEON:  Surgeon(s) and Role:    Kerin Perna, MD - Primary  PHYSICIAN ASSISTANT: Teesha Ohm PA-C  ANESTHESIA:   general  EBL:  1000 mL   BLOOD ADMINISTERED:none  DRAINS: PLEURAL AND PERICARDIAL CHEST TUBES   LOCAL MEDICATIONS USED:  NONE  SPECIMEN:  No Specimen  DISPOSITION OF SPECIMEN:  N/A  COUNTS:  YES  TOURNIQUET:  * No tourniquets in log *  DICTATION: .Other Dictation: Dictation Number [id number]  PLAN OF CARE: Admit to inpatient   PATIENT DISPOSITION:  ICU - intubated and hemodynamically stable.   Delay start of Pharmacological VTE agent (>24hrs) due to surgical blood loss or risk of bleeding: yes  COMPLICATIONS: NO KNOWN

## 2018-04-24 NOTE — Progress Notes (Signed)
Weaning initiated again at this time. Pt is alert and following commands. This RRT ask patient for left hand squeeze, patient was able to squeeze firmly, ask patient to push her right foot against my hand, patient complied no complications noted.

## 2018-04-24 NOTE — Progress Notes (Signed)
  Echocardiogram Echocardiogram Transesophageal has been performed.  Gerda Diss 04/24/2018, 8:53 AM

## 2018-04-25 ENCOUNTER — Inpatient Hospital Stay (HOSPITAL_COMMUNITY): Payer: Medicaid Other

## 2018-04-25 ENCOUNTER — Encounter (HOSPITAL_COMMUNITY): Payer: Self-pay | Admitting: Cardiothoracic Surgery

## 2018-04-25 DIAGNOSIS — Z951 Presence of aortocoronary bypass graft: Secondary | ICD-10-CM

## 2018-04-25 LAB — POCT I-STAT, CHEM 8
BUN: 11 mg/dL (ref 6–20)
Calcium, Ion: 1.23 mmol/L (ref 1.15–1.40)
Chloride: 103 mmol/L (ref 98–111)
Creatinine, Ser: 0.7 mg/dL (ref 0.44–1.00)
Glucose, Bld: 123 mg/dL — ABNORMAL HIGH (ref 70–99)
HEMATOCRIT: 30 % — AB (ref 36.0–46.0)
HEMOGLOBIN: 10.2 g/dL — AB (ref 12.0–15.0)
Potassium: 4 mmol/L (ref 3.5–5.1)
SODIUM: 136 mmol/L (ref 135–145)
TCO2: 23 mmol/L (ref 22–32)

## 2018-04-25 LAB — BASIC METABOLIC PANEL
Anion gap: 6 (ref 5–15)
BUN: 8 mg/dL (ref 6–20)
CHLORIDE: 109 mmol/L (ref 98–111)
CO2: 22 mmol/L (ref 22–32)
Calcium: 7.9 mg/dL — ABNORMAL LOW (ref 8.9–10.3)
Creatinine, Ser: 0.7 mg/dL (ref 0.44–1.00)
Glucose, Bld: 92 mg/dL (ref 70–99)
POTASSIUM: 4 mmol/L (ref 3.5–5.1)
SODIUM: 137 mmol/L (ref 135–145)

## 2018-04-25 LAB — POCT I-STAT 4, (NA,K, GLUC, HGB,HCT)
Glucose, Bld: 89 mg/dL (ref 70–99)
HCT: 32 % — ABNORMAL LOW (ref 36.0–46.0)
HEMOGLOBIN: 10.9 g/dL — AB (ref 12.0–15.0)
POTASSIUM: 4.1 mmol/L (ref 3.5–5.1)
SODIUM: 140 mmol/L (ref 135–145)

## 2018-04-25 LAB — CBC
HCT: 33 % — ABNORMAL LOW (ref 36.0–46.0)
HEMATOCRIT: 33.1 % — AB (ref 36.0–46.0)
HEMOGLOBIN: 9.6 g/dL — AB (ref 12.0–15.0)
HEMOGLOBIN: 9.6 g/dL — AB (ref 12.0–15.0)
MCH: 25 pg — ABNORMAL LOW (ref 26.0–34.0)
MCH: 25.3 pg — AB (ref 26.0–34.0)
MCHC: 29.1 g/dL — ABNORMAL LOW (ref 30.0–36.0)
MCHC: 29 g/dL — AB (ref 30.0–36.0)
MCV: 85.9 fL (ref 80.0–100.0)
MCV: 87.3 fL (ref 80.0–100.0)
Platelets: 171 10*3/uL (ref 150–400)
Platelets: 169 10*3/uL (ref 150–400)
RBC: 3.84 MIL/uL — AB (ref 3.87–5.11)
RBC: 3.79 MIL/uL — ABNORMAL LOW (ref 3.87–5.11)
RDW: 16.4 % — ABNORMAL HIGH (ref 11.5–15.5)
RDW: 17.3 % — ABNORMAL HIGH (ref 11.5–15.5)
WBC: 9.5 10*3/uL (ref 4.0–10.5)
WBC: 10.1 10*3/uL (ref 4.0–10.5)
nRBC: 0 % (ref 0.0–0.2)
nRBC: 0 % (ref 0.0–0.2)

## 2018-04-25 LAB — GLUCOSE, CAPILLARY
GLUCOSE-CAPILLARY: 113 mg/dL — AB (ref 70–99)
GLUCOSE-CAPILLARY: 114 mg/dL — AB (ref 70–99)
GLUCOSE-CAPILLARY: 71 mg/dL (ref 70–99)
GLUCOSE-CAPILLARY: 94 mg/dL (ref 70–99)
Glucose-Capillary: 102 mg/dL — ABNORMAL HIGH (ref 70–99)

## 2018-04-25 LAB — BPAM FFP
BLOOD PRODUCT EXPIRATION DATE: 201910312359
Blood Product Expiration Date: 201910312359
ISSUE DATE / TIME: 201910311557
ISSUE DATE / TIME: 201910311145
Unit Type and Rh: 6200
Unit Type and Rh: 8400

## 2018-04-25 LAB — POCT I-STAT 7, (LYTES, BLD GAS, ICA,H+H)
Acid-base deficit: 2 mmol/L (ref 0.0–2.0)
Bicarbonate: 22.7 mmol/L (ref 20.0–28.0)
Calcium, Ion: 1.1 mmol/L — ABNORMAL LOW (ref 1.15–1.40)
HCT: 24 % — ABNORMAL LOW (ref 36.0–46.0)
Hemoglobin: 8.2 g/dL — ABNORMAL LOW (ref 12.0–15.0)
O2 SAT: 100 %
PCO2 ART: 39.5 mmHg (ref 32.0–48.0)
PO2 ART: 441 mmHg — AB (ref 83.0–108.0)
Patient temperature: 37.3
Potassium: 4.3 mmol/L (ref 3.5–5.1)
Sodium: 139 mmol/L (ref 135–145)
TCO2: 24 mmol/L (ref 22–32)
pH, Arterial: 7.368 (ref 7.350–7.450)

## 2018-04-25 LAB — CREATININE, SERUM
Creatinine, Ser: 0.76 mg/dL (ref 0.44–1.00)
GFR calc Af Amer: >60 mL/min (ref >60)

## 2018-04-25 LAB — PREPARE FRESH FROZEN PLASMA

## 2018-04-25 LAB — MAGNESIUM
MAGNESIUM: 2.3 mg/dL (ref 1.7–2.4)
MAGNESIUM: 2 mg/dL (ref 1.7–2.4)

## 2018-04-25 MED ORDER — MORPHINE SULFATE (PF) 2 MG/ML IV SOLN: 2.0000 mg | INTRAVENOUS | Status: DC | PRN

## 2018-04-25 MED ORDER — FUROSEMIDE 10 MG/ML IJ SOLN
20.0000 mg | Freq: Every day | INTRAMUSCULAR | Status: DC
  Administered 2018-04-25 – 2018-04-26 (×2): 20 mg via INTRAVENOUS
  Filled 2018-04-25 (×2): qty 2

## 2018-04-25 MED ORDER — VANCOMYCIN HCL IN DEXTROSE 1-5 GM/200ML-% IV SOLN
1000.0000 mg | Freq: Once | INTRAVENOUS | Status: AC
  Administered 2018-04-25: 1000 mg via INTRAVENOUS
  Filled 2018-04-25: qty 200

## 2018-04-25 MED ORDER — INSULIN ASPART 100 UNIT/ML ~~LOC~~ SOLN: 0.0000 [IU] | SUBCUTANEOUS | Status: DC

## 2018-04-25 MED FILL — Calcium Chloride Inj 10%: INTRAVENOUS | Qty: 10 | Status: AC

## 2018-04-25 MED FILL — Mannitol IV Soln 20%: INTRAVENOUS | Qty: 500 | Status: AC

## 2018-04-25 MED FILL — Sodium Chloride IV Soln 0.9%: INTRAVENOUS | Qty: 3000 | Status: AC

## 2018-04-25 MED FILL — Electrolyte-R (PH 7.4) Solution: INTRAVENOUS | Qty: 4000 | Status: AC

## 2018-04-25 MED FILL — Lidocaine HCl Local Preservative Free (PF) Inj 1%: INTRAMUSCULAR | Qty: 2 | Status: AC

## 2018-04-25 MED FILL — Heparin Sodium (Porcine) Inj 1000 Unit/ML: INTRAMUSCULAR | Qty: 10 | Status: AC

## 2018-04-25 MED FILL — Sodium Bicarbonate IV Soln 8.4%: INTRAVENOUS | Qty: 50 | Status: AC

## 2018-04-25 MED FILL — Heparin Sodium (Porcine) Inj 1000 Unit/ML: INTRAMUSCULAR | Qty: 30 | Status: AC

## 2018-04-25 NOTE — Progress Notes (Signed)
1 Day Post-Op Procedure(s) (LRB): CORONARY ARTERY BYPASS GRAFTING (CABG) x3  using left internal mammary artery and right greater saphenous vein harvested endoscopically. (N/A) TRANSESOPHAGEAL ECHOCARDIOGRAM (TEE) (N/A) Subjective: Mild chest soreness nsr  CI 2.3 cxr clear Objective: Vital signs in last 24 hours: Temp:  [96.4 F (35.8 C)-99.1 F (37.3 C)] 99.1 F (37.3 C) (11/01 0800) Pulse Rate:  [79-95] 95 (11/01 0800) Cardiac Rhythm: Atrial paced (11/01 0800) Resp:  [0-31] 27 (11/01 0800) BP: (86-136)/(55-83) 136/75 (11/01 0800) SpO2:  [96 %-100 %] 96 % (11/01 0800) Arterial Line BP: (97-154)/(47-68) 154/67 (11/01 0800) FiO2 (%):  [40 %-50 %] 40 % (10/31 2045) Weight:  [76 kg] 76 kg (11/01 0452)  Hemodynamic parameters for last 24 hours: PAP: (12-28)/(7-24) 28/24 CO:  [3.3 L/min-5 L/min] 5 L/min CI:  [2.1 L/min/m2-3.2 L/min/m2] 3.2 L/min/m2  Intake/Output from previous day: 10/31 0701 - 11/01 0700 In: 4220.6 [I.V.:2480.6; Blood:738; IV Piggyback:1002] Out: 3198 [Urine:1970; Blood:1000; Chest Tube:228] Intake/Output this shift: Total I/O In: 305.3 [I.V.:305.3] Out: 120 [Urine:70; Chest Tube:50]       Exam    General- alert and comfortable    Neck- no JVD, no cervical adenopathy palpable, no carotid bruit   Lungs- clear without rales, wheezes   Cor- regular rate and rhythm, no murmur , gallop   Abdomen- soft, non-tender   Extremities - warm, non-tender, minimal edema   Neuro- oriented, appropriate, no focal weakness   Lab Results: Recent Labs    04/24/18 1940 04/24/18 2007 04/25/18 0506  WBC 10.8*  --  9.5  HGB 10.5* 11.2* 9.6*  HCT 35.3* 33.0* 33.0*  PLT 189  --  171   BMET:  Recent Labs    04/24/18 0358  04/24/18 2007 04/25/18 0506  NA 136   < > 138 137  K 4.0   < > 4.3 4.0  CL 105   < > 107 109  CO2 24  --   --  22  GLUCOSE 84   < > 118* 92  BUN 7   < > 8 8  CREATININE 0.69   < > 0.60 0.70  CALCIUM 8.9  --   --  7.9*   < > = values in this  interval not displayed.    PT/INR:  Recent Labs    04/24/18 1404  LABPROT 15.9*  INR 1.28   ABG    Component Value Date/Time   PHART 7.450 04/24/2018 2108   HCO3 25.7 04/24/2018 2108   TCO2 27 04/24/2018 2108   ACIDBASEDEF 1.0 04/24/2018 1824   O2SAT 100.0 04/24/2018 2108   CBG (last 3)  Recent Labs    04/24/18 2003 04/24/18 2331 04/25/18 0346  GLUCAP 118* 113* 71    Assessment/Plan: S/P Procedure(s) (LRB): CORONARY ARTERY BYPASS GRAFTING (CABG) x3  using left internal mammary artery and right greater saphenous vein harvested endoscopically. (N/A) TRANSESOPHAGEAL ECHOCARDIOGRAM (TEE) (N/A) Mobilize Diuresis d/c tubes/lines See progression orders   LOS: 7 days    [name] 04/25/2018

## 2018-04-25 NOTE — Progress Notes (Signed)
Patient ID: [name], female   DOB: 11/25/1965, 52 y.o.   MRN: [id number] [facility] Evening Rounds:  Hemodynamically stable in sinus rhythm.  sats 94% BMET    Component Value Date/Time   NA 136 04/25/2018 1807   K 4.0 04/25/2018 1807   CL 103 04/25/2018 1807   CO2 22 04/25/2018 0506   GLUCOSE 123 (H) 04/25/2018 1807   BUN 11 04/25/2018 1807   CREATININE 0.70 04/25/2018 1807   CALCIUM 7.9 (L) 04/25/2018 0506   GFRNONAA >60 04/25/2018 0506   GFRAA >60 04/25/2018 0506   CBC    Component Value Date/Time   WBC 10.1 04/25/2018 1806   RBC 3.79 (L) 04/25/2018 1806   HGB 10.2 (L) 04/25/2018 1807   HCT 30.0 (L) 04/25/2018 1807   PLT 169 04/25/2018 1806   MCV 87.3 04/25/2018 1806   MCH 25.3 (L) 04/25/2018 1806   MCHC 29.0 (L) 04/25/2018 1806   RDW 17.3 (H) 04/25/2018 1806

## 2018-04-25 NOTE — Op Note (Signed)
[name], [name] MEDICAL RECORD ZO:[id number] ACCOUNT [id number] DATE OF BIRTH:January 28, 1966 FACILITY: [facility] LOCATION: [facility] PHYSICIAN:Katherine Mihalik VAN TRIGT III, MD  OPERATIVE REPORT  DATE OF PROCEDURE:  04/24/2018  OPERATIONS: 1.  Coronary artery bypass grafting x3 (left internal mammary artery to left anterior descending, saphenous vein graft to circumflex marginal, saphenous vein graft to right coronary artery, right ventricular marginal branch. 2.  Endoscopic harvest of right leg greater saphenous vein.  SURGEON:  Katherine Nations Trigt III, MD  ASSISTANT:  Katherine Crane, PA-C  PREOPERATIVE DIAGNOSES:  Non-ST elevation myocardial infarction with severe 3-vessel CAD and mild left ventricular dysfunction.  POSTOPERATIVE DIAGNOSES:  Non-ST elevation myocardial infarction with severe 3-vessel disease and mild left ventricular dysfunction.  ANESTHESIA:  General.  CLINICAL NOTE:  The patient is a 52 year old Asian female who presented with chest pain and positive cardiac enzymes.  She underwent cardiac catheterization, which showed severe 3-vessel coronary artery disease with chronic occlusion of the RCA and an  atretic distal vessel, high-grade heavily calcified 95% LAD stenosis and high-grade 95% calcified proximal circumflex stenosis.  LVEDP was elevated at 22.  She was felt to be a candidate for surgical coronary revascularization.  I reviewed the coronary  angiogram images and reexamined the patient.  I felt that her coronary targets were suboptimal, small and diffusely diseased, but that surgical revascularization would be her best option for long-term therapy of her severe CAD.  I discussed the CABG  procedure in detail with the patient and her husband including the indications and the expected benefits, alternatives and risks.  They understood the risks to include stroke, bleeding, blood transfusion requirement, postoperative infection,  postoperative organ failure, postoperative pulmonary problems  including pleural effusion, and death.  She demonstrated understanding and agreed to proceed with surgery under what I felt was informed consent.  OPERATIVE FINDINGS: 1.  Adequate vein conduit. 2.  Small but adequate flow left IMA. 3.  Small heavily diseased targets which would not be a candidate for redo bypass grafting. 4.  Preserved LV function after separation from cardiopulmonary bypass of CABG x3.  DESCRIPTION OF PROCEDURE:  The patient was brought to the operating room and placed supine on the operating table where general anesthesia was induced.  A transesophageal echo probe was placed by the anesthesia team.  The chest, abdomen and legs were  prepped and draped as a sterile field.  A proper time-out was performed.  At this point, it was realized the radial A-line was dislodged and was not functional.  A femoral line was placed in the right femoral artery to monitor arterial pressure during  the operation.  A sternal incision was made as the saphenous vein was harvested endoscopically from the right leg.  The left internal mammary artery was harvested as a pedicle graft.  It was a 1.2 mm vessel, but had adequate flow.  The sternal retractor was placed, and  the pericardium was opened and suspended.  Pursestrings were placed in the ascending aorta and right atrium and the ACT was therapeutic.  The patient was cannulated and placed on bypass.  The coronaries were identified for grafting.  The right coronary  was very atretic and the anastomosis was placed to the RV marginal branch, which was the only substantial portion of the right coronary system.  Cardioplegic cannulas were placed both antegrade and retrograde cold blood cardioplegia.  The patient was cooled to 32 degrees.  The aortic crossclamp was applied and 1 liter of cold blood cardioplegia was delivered in split doses  between the antegrade  aortic and retrograde coronary sinus catheters.  There was good cardioplegic arrest and supple  temperature dropped less than 12 degrees.  Cardioplegia was delivered every 20 minutes while the crossclamp was placed.  The distal coronary anastomoses were performed.  The first distal anastomosis was to the RCA.  RV marginal.  There was a 1.4 mm vessel, proximal 100% occlusion.  A reverse saphenous vein was sewn end-to-side with running 7-0 Prolene with good flow  through the graft.  Cardioplegia was redosed.  A second distal anastomosis was the OM branch of the left coronary.  This was a 1.4 mm heavily thickened vessel, but it took a 1.5 mm probe.  A reverse saphenous vein was sewn end-to-side with running 7-0 Prolene.  There was excellent flow through the  graft.  Cardioplegia was redosed.  The third distal anastomosis was the distal third of the LAD.  This had a proximal 95% stenosis.  It was 1.4 mm vessel.  The left IMA pedicle was brought through an opening in  the left lateral pericardium and was brought down onto the LAD and sewn  end-to-side with running 8-0 Prolene.  There was good flow through the anastomosis after briefly releasing the bulldog on the pedicle.  The bulldog was reapplied, and the pedicle secured to the epicardium.  Cardioplegia was redosed.  While the cross clamp was still in place, 2 proximal vein anastomoses were performed on the ascending aorta with a 4.5 mm punch and running 6-0 Prolene.  Prior to tying down the final proximal anastomosis, air was vented from the coronaries with a dose  of retrograde warm blood cardioplegia.  The crossclamp was removed.  The vein grafts were deaired and opened and each had good flow.  Hemostasis was documented at the proximal and distal anastomoses.  The patient was rewarmed and reperfused.  Low dose milrinone was started.  The lungs reexpanded and ventilator was  resumed.  The patient was weaned off cardiopulmonary bypass without difficulty.  Echo showed preserved LV systolic function.  Vital signs were stable.  Protamine was  administered without adverse reaction.  The cannulas were removed.  The mediastinum was  irrigated.  The patient remained stable.  Anterior mediastinal and left pleural chest tubes were placed.  The superior pericardial fat was closed over the aorta and vein grafts.  The sternum was closed with a wire.  The pectoralis fascia was closed with a running #1 Vicryl.  Subcutaneous and  skin layers were closed with running Vicryl and sterile dressings were applied.    Total cardiopulmonary bypass time was 108 minutes.  AN/NUANCE  D:04/24/2018 T:04/25/2018 JOB:[id number]

## 2018-04-25 NOTE — Anesthesia Postprocedure Evaluation (Signed)
Anesthesia Post Note  Patient: [name]  Procedure(s) Performed: CORONARY ARTERY BYPASS GRAFTING (CABG) x3  using left internal mammary artery and right greater saphenous vein harvested endoscopically. (N/A Chest) TRANSESOPHAGEAL ECHOCARDIOGRAM (TEE) (N/A )     Patient location during evaluation: SICU Anesthesia Type: General Level of consciousness: sedated and patient remains intubated per anesthesia plan Pain management: pain level controlled Vital Signs Assessment: post-procedure vital signs reviewed and stable Respiratory status: patient remains intubated per anesthesia plan Cardiovascular status: stable Anesthetic complications: no    Last Vitals:  Vitals:   04/25/18 1600 04/25/18 1629  BP: 109/63   Pulse: 81   Resp: (!) 26   Temp:  37 C  SpO2: 96%     Last Pain:  Vitals:   04/25/18 1629  TempSrc: Oral  PainSc:                  Lewie Loron

## 2018-04-26 ENCOUNTER — Inpatient Hospital Stay (HOSPITAL_COMMUNITY): Payer: Medicaid Other

## 2018-04-26 LAB — CBC
HCT: 31.7 % — ABNORMAL LOW (ref 36.0–46.0)
Hemoglobin: 9.3 g/dL — ABNORMAL LOW (ref 12.0–15.0)
MCH: 26.2 pg (ref 26.0–34.0)
MCHC: 29.3 g/dL — ABNORMAL LOW (ref 30.0–36.0)
MCV: 89.3 fL (ref 80.0–100.0)
Platelets: 156 10*3/uL (ref 150–400)
RBC: 3.55 MIL/uL — ABNORMAL LOW (ref 3.87–5.11)
RDW: 17.8 % — ABNORMAL HIGH (ref 11.5–15.5)
WBC: 9.2 10*3/uL (ref 4.0–10.5)
nRBC: 0 % (ref 0.0–0.2)

## 2018-04-26 LAB — BASIC METABOLIC PANEL
Anion gap: 4 — ABNORMAL LOW (ref 5–15)
BUN: 10 mg/dL (ref 6–20)
CO2: 25 mmol/L (ref 22–32)
Calcium: 8.2 mg/dL — ABNORMAL LOW (ref 8.9–10.3)
Chloride: 106 mmol/L (ref 98–111)
Creatinine, Ser: 0.67 mg/dL (ref 0.44–1.00)
GFR calc Af Amer: >60 mL/min (ref >60)
GFR calc non Af Amer: >60 mL/min (ref >60)
Glucose, Bld: 84 mg/dL (ref 70–99)
Potassium: 4 mmol/L (ref 3.5–5.1)
Sodium: 135 mmol/L (ref 135–145)

## 2018-04-26 LAB — GLUCOSE, CAPILLARY
GLUCOSE-CAPILLARY: 107 mg/dL — AB (ref 70–99)
GLUCOSE-CAPILLARY: 83 mg/dL (ref 70–99)
GLUCOSE-CAPILLARY: 98 mg/dL (ref 70–99)

## 2018-04-26 MED ORDER — TRAMADOL HCL 50 MG PO TABS: 50.0000 mg | ORAL_TABLET | ORAL | Status: DC | PRN

## 2018-04-26 MED ORDER — MUPIROCIN 2 % EX OINT
1.0000 "application " | TOPICAL_OINTMENT | Freq: Two times a day (BID) | CUTANEOUS | Status: DC
  Administered 2018-04-26 – 2018-04-29 (×7): 1 via NASAL
  Filled 2018-04-26: qty 22

## 2018-04-26 MED ORDER — CHLORHEXIDINE GLUCONATE CLOTH 2 % EX PADS
6.0000 | MEDICATED_PAD | Freq: Every day | CUTANEOUS | Status: DC
  Administered 2018-04-28: 6 via TOPICAL

## 2018-04-26 MED ORDER — FUROSEMIDE 10 MG/ML IJ SOLN
20.0000 mg | Freq: Two times a day (BID) | INTRAMUSCULAR | Status: DC
  Administered 2018-04-26 – 2018-04-27 (×3): 20 mg via INTRAVENOUS
  Filled 2018-04-26 (×3): qty 2

## 2018-04-26 MED ORDER — POTASSIUM CHLORIDE CRYS ER 20 MEQ PO TBCR
20.0000 meq | EXTENDED_RELEASE_TABLET | Freq: Two times a day (BID) | ORAL | Status: DC
  Administered 2018-04-26 – 2018-04-27 (×3): 20 meq via ORAL
  Filled 2018-04-26 (×3): qty 1

## 2018-04-26 NOTE — Progress Notes (Signed)
Patient ID: [name], female   DOB: February 14, 1966, 52 y.o.   MRN: [id number] [facility] Evening Rounds:  Hemodynamically stable in sinus rhythm.  Good urine output  Ambulated today.

## 2018-04-26 NOTE — Progress Notes (Signed)
2 Days Post-Op Procedure(s) (LRB): CORONARY ARTERY BYPASS GRAFTING (CABG) x3  using left internal mammary artery and right greater saphenous vein harvested endoscopically. (N/A) TRANSESOPHAGEAL ECHOCARDIOGRAM (TEE) (N/A) Subjective: No complaints  Objective: Vital signs in last 24 hours: Temp:  [98.2 F (36.8 C)-98.6 F (37 C)] 98.5 F (36.9 C) (11/02 1217) Pulse Rate:  [73-88] 79 (11/02 1200) Cardiac Rhythm: Normal sinus rhythm (11/02 1600) Resp:  [15-27] 17 (11/02 1200) BP: (95-120)/(57-77) 103/57 (11/02 1200) SpO2:  [91 %-100 %] 100 % (11/02 1200) Arterial Line BP: (110-116)/(57-63) 116/63 (11/01 1800) Weight:  [76.2 kg] 76.2 kg (11/02 0458)  Hemodynamic parameters for last 24 hours:    Intake/Output from previous day: 11/01 0701 - 11/02 0700 In: 735.1 [I.V.:535.1; IV Piggyback:200] Out: 800 [Urine:750; Chest Tube:50] Intake/Output this shift: Total I/O In: 303 [P.O.:240; I.V.:37.5; IV Piggyback:25.5] Out: 25 [Urine:25]  General appearance: alert and cooperative Neurologic: intact Heart: regular rate and rhythm, S1, S2 normal, no murmur, click, rub or gallop Lungs: diminished breath sounds LLL and RLL Extremities: edema mild Wound: dressing dry  Lab Results: Recent Labs    04/25/18 1806 04/25/18 1807 04/26/18 0424  WBC 10.1  --  9.2  HGB 9.6* 10.2* 9.3*  HCT 33.1* 30.0* 31.7*  PLT 169  --  156   BMET:  Recent Labs    04/25/18 0506  04/25/18 1807 04/26/18 0424  NA 137  --  136 135  K 4.0  --  4.0 4.0  CL 109  --  103 106  CO2 22  --   --  25  GLUCOSE 92  --  123* 84  BUN 8  --  11 10  CREATININE 0.70   < > 0.70 0.67  CALCIUM 7.9*  --   --  8.2*   < > = values in this interval not displayed.    PT/INR:  Recent Labs    04/24/18 1404  LABPROT 15.9*  INR 1.28   ABG    Component Value Date/Time   PHART 7.450 04/24/2018 2108   HCO3 25.7 04/24/2018 2108   TCO2 23 04/25/2018 1807   ACIDBASEDEF 1.0 04/24/2018 1824   O2SAT 100.0 04/24/2018 2108    CBG (last 3)  Recent Labs    04/26/18 0017 04/26/18 0426 04/26/18 1215  GLUCAP 98 83 107*   CLINICAL DATA:  Post CABG, history hypertension  EXAM: PORTABLE CHEST 1 VIEW  COMPARISON:  Portable exam 0522 hours compared to 04/25/2018  FINDINGS: Interval removal of Swan-Ganz catheter, mediastinal drain and LEFT thoracostomy tube.  Epicardial pacer wires again seen.  RIGHT jugular central venous catheter with tip projecting over proximal SVC.  Enlargement of cardiac silhouette post CABG.  Prominent mediastinum consistent with preceding surgery.  Bibasilar atelectasis.  No acute infiltrate or pneumothorax.  IMPRESSION: Enlargement of cardiac silhouette post CABG.  Bibasilar atelectasis.  No pneumothorax following LEFT chest tube removal.   Electronically Signed   By: Ulyses Southward M.D.   On: 04/26/2018 07:42  Assessment/Plan: S/P Procedure(s) (LRB): CORONARY ARTERY BYPASS GRAFTING (CABG) x3  using left internal mammary artery and right greater saphenous vein harvested endoscopically. (N/A) TRANSESOPHAGEAL ECHOCARDIOGRAM (TEE) (N/A)  POD 2 Hemodynamically stable Volume excess: wt is 11 lbs over preop. Continue diuresis and replace K+ Bibasilar atelectasis: Encourage IS and ambulation.   LOS: 8 days    Alleen Borne 04/26/2018

## 2018-04-27 ENCOUNTER — Inpatient Hospital Stay (HOSPITAL_COMMUNITY): Payer: Medicaid Other

## 2018-04-27 LAB — CBC
HCT: 29.6 % — ABNORMAL LOW (ref 36.0–46.0)
HEMOGLOBIN: 8.7 g/dL — AB (ref 12.0–15.0)
MCH: 26.1 pg (ref 26.0–34.0)
MCHC: 29.4 g/dL — ABNORMAL LOW (ref 30.0–36.0)
MCV: 88.9 fL (ref 80.0–100.0)
NRBC: 0 % (ref 0.0–0.2)
PLATELETS: 135 10*3/uL — AB (ref 150–400)
RBC: 3.33 MIL/uL — ABNORMAL LOW (ref 3.87–5.11)
RDW: 18.1 % — ABNORMAL HIGH (ref 11.5–15.5)
WBC: 7.4 10*3/uL (ref 4.0–10.5)

## 2018-04-27 LAB — BASIC METABOLIC PANEL
Anion gap: 8 (ref 5–15)
BUN: 11 mg/dL (ref 6–20)
CO2: 24 mmol/L (ref 22–32)
CREATININE: 0.55 mg/dL (ref 0.44–1.00)
Calcium: 8.1 mg/dL — ABNORMAL LOW (ref 8.9–10.3)
Chloride: 103 mmol/L (ref 98–111)
GFR calc non Af Amer: >60 mL/min (ref >60)
Glucose, Bld: 77 mg/dL (ref 70–99)
POTASSIUM: 3.9 mmol/L (ref 3.5–5.1)
Sodium: 135 mmol/L (ref 135–145)

## 2018-04-27 LAB — BPAM RBC
Blood Product Expiration Date: 201911282359
Blood Product Expiration Date: 201911282359
Blood Product Expiration Date: 201911282359
Blood Product Expiration Date: 201911282359
Blood Product Expiration Date: 201911282359
Blood Product Expiration Date: 201911282359
ISSUE DATE / TIME: 201910310936
ISSUE DATE / TIME: 201910310936
ISSUE DATE / TIME: 201910311008
ISSUE DATE / TIME: 201910311008
Unit Type and Rh: 6200
Unit Type and Rh: 6200
Unit Type and Rh: 6200
Unit Type and Rh: 6200
Unit Type and Rh: 6200
Unit Type and Rh: 6200

## 2018-04-27 LAB — TYPE AND SCREEN
ABO/RH(D): A POS
Antibody Screen: NEGATIVE
Unit division: 0
Unit division: 0
Unit division: 0
Unit division: 0
Unit division: 0
Unit division: 0

## 2018-04-27 LAB — GLUCOSE, CAPILLARY: GLUCOSE-CAPILLARY: 77 mg/dL (ref 70–99)

## 2018-04-27 NOTE — Plan of Care (Signed)
  Problem: Education: Goal: Understanding of cardiac disease, CV risk reduction, and recovery process will improve Outcome: Progressing Goal: Individualized Educational Video(s) Outcome: Progressing   Problem: Cardiac: Goal: Ability to achieve and maintain adequate cardiovascular perfusion will improve Outcome: Progressing   Problem: Health Behavior/Discharge Planning: Goal: Ability to safely manage health-related needs after discharge will improve Outcome: Progressing   Problem: Education: Goal: Knowledge of General Education information will improve Description Including pain rating scale, medication(s)/side effects and non-pharmacologic comfort measures Outcome: Progressing   Problem: Health Behavior/Discharge Planning: Goal: Ability to manage health-related needs will improve Outcome: Progressing   Problem: Clinical Measurements: Goal: Ability to maintain clinical measurements within normal limits will improve Outcome: Progressing Goal: Will remain free from infection Outcome: Progressing Goal: Diagnostic test results will improve Outcome: Progressing Goal: Respiratory complications will improve Outcome: Progressing Goal: Cardiovascular complication will be avoided Outcome: Progressing   Problem: Activity: Goal: Risk for activity intolerance will decrease Outcome: Progressing   Problem: Coping: Goal: Level of anxiety will decrease Outcome: Progressing   Problem: Elimination: Goal: Will not experience complications related to bowel motility Outcome: Progressing Goal: Will not experience complications related to urinary retention Outcome: Progressing   Problem: Pain Managment: Goal: General experience of comfort will improve Outcome: Progressing   Problem: Safety: Goal: Ability to remain free from injury will improve Outcome: Progressing   Problem: Skin Integrity: Goal: Risk for impaired skin integrity will decrease Outcome: Progressing

## 2018-04-27 NOTE — Progress Notes (Signed)
Patient ID: [name], female   DOB: 31-Jan-1966, 52 y.o.   MRN: [id number] [facility] Evening Rounds:  Stable day Awaiting transfer to 4E.

## 2018-04-27 NOTE — Progress Notes (Signed)
This note also relates to the following rows which could not be included: Rate - Cannot attach notes to extension rows  Manually adjusted d/t time change

## 2018-04-27 NOTE — Progress Notes (Signed)
RN aware of  d/c CVC order. 

## 2018-04-27 NOTE — Progress Notes (Signed)
3 Days Post-Op Procedure(s) (LRB): CORONARY ARTERY BYPASS GRAFTING (CABG) x3  using left internal mammary artery and right greater saphenous vein harvested endoscopically. (N/A) TRANSESOPHAGEAL ECHOCARDIOGRAM (TEE) (N/A) Subjective: No complaints. Feeling better  Objective: Vital signs in last 24 hours: Temp:  [98.1 F (36.7 C)-98.7 F (37.1 C)] 98.5 F (36.9 C) (11/03 0700) Pulse Rate:  [74-92] 85 (11/03 1000) Cardiac Rhythm: Normal sinus rhythm (11/03 0800) Resp:  [15-26] 24 (11/03 1000) BP: (95-131)/(48-100) 103/66 (11/03 1000) SpO2:  [91 %-100 %] 100 % (11/03 1000) Weight:  [74.6 kg] 74.6 kg (11/03 0600)  Hemodynamic parameters for last 24 hours:    Intake/Output from previous day: 11/02 0701 - 11/03 0700 In: 1077.6 [P.O.:600; I.V.:452.1; IV Piggyback:25.5] Out: 1255 [Urine:1255] Intake/Output this shift: Total I/O In: 40 [I.V.:40] Out: 350 [Urine:350]  General appearance: alert and cooperative Neurologic: intact Heart: regular rate and rhythm, S1, S2 normal, no murmur, click, rub or gallop Lungs: clear to auscultation bilaterally Extremities: edema mild Wound: incisions ok  Lab Results: Recent Labs    04/26/18 0424 04/27/18 0454  WBC 9.2 7.4  HGB 9.3* 8.7*  HCT 31.7* 29.6*  PLT 156 135*   BMET:  Recent Labs    04/26/18 0424 04/27/18 0454  NA 135 135  K 4.0 3.9  CL 106 103  CO2 25 24  GLUCOSE 84 77  BUN 10 11  CREATININE 0.67 0.55  CALCIUM 8.2* 8.1*    PT/INR:  Recent Labs    04/24/18 1404  LABPROT 15.9*  INR 1.28   ABG    Component Value Date/Time   PHART 7.450 04/24/2018 2108   HCO3 25.7 04/24/2018 2108   TCO2 23 04/25/2018 1807   ACIDBASEDEF 1.0 04/24/2018 1824   O2SAT 100.0 04/24/2018 2108   CBG (last 3)  Recent Labs    04/26/18 0426 04/26/18 0748 04/26/18 1215  GLUCAP 83 77 107*   CLINICAL DATA:  History of CABG.  EXAM: PORTABLE CHEST 1 VIEW  COMPARISON:  04/26/2018  FINDINGS: Stable postsurgical changes from  CABG. Stable position of right internal jugular approach venous sheath.  Enlarged cardiac silhouette.  Small bilateral pleural effusions with lower lobe atelectasis versus airspace consolidation. Minimal prominence of the interstitium.  Osseous structures are without acute abnormality. Soft tissues are grossly normal.  IMPRESSION: Small bilateral pleural effusions with lower lobe atelectasis versus airspace consolidation.  Minimal pulmonary vascular congestion.   Electronically Signed   By: Ted Mcalpine M.D.   On: 04/27/2018 08:06  Assessment/Plan: S/P Procedure(s) (LRB): CORONARY ARTERY BYPASS GRAFTING (CABG) x3  using left internal mammary artery and right greater saphenous vein harvested endoscopically. (N/A) TRANSESOPHAGEAL ECHOCARDIOGRAM (TEE) (N/A)  POD 3 CABG Hemodynamically stable in sinus rhythm. Will resume Coreg that she was on previously. S/p NSTEMI. Add Plavix to ASA after pacing wires out.  Mild volume excess: continue diuresis  DC sleeve.  Transfer to 4E, continue IS, ambulation.   LOS: 9 days    Alleen Borne 04/27/2018

## 2018-04-28 LAB — BASIC METABOLIC PANEL
Anion gap: 7 (ref 5–15)
BUN: 12 mg/dL (ref 6–20)
CHLORIDE: 108 mmol/L (ref 98–111)
CO2: 20 mmol/L — AB (ref 22–32)
CREATININE: 0.67 mg/dL (ref 0.44–1.00)
Calcium: 8.4 mg/dL — ABNORMAL LOW (ref 8.9–10.3)
GFR calc non Af Amer: >60 mL/min (ref >60)
Glucose, Bld: 95 mg/dL (ref 70–99)
POTASSIUM: 4.4 mmol/L (ref 3.5–5.1)
Sodium: 135 mmol/L (ref 135–145)

## 2018-04-28 LAB — CBC
HEMATOCRIT: 31.8 % — AB (ref 36.0–46.0)
HEMOGLOBIN: 9.1 g/dL — AB (ref 12.0–15.0)
MCH: 25.6 pg — ABNORMAL LOW (ref 26.0–34.0)
MCHC: 28.6 g/dL — ABNORMAL LOW (ref 30.0–36.0)
MCV: 89.6 fL (ref 80.0–100.0)
NRBC: 0 % (ref 0.0–0.2)
Platelets: 207 10*3/uL (ref 150–400)
RBC: 3.55 MIL/uL — ABNORMAL LOW (ref 3.87–5.11)
RDW: 18.6 % — AB (ref 11.5–15.5)
WBC: 6.3 10*3/uL (ref 4.0–10.5)

## 2018-04-28 LAB — GLUCOSE, CAPILLARY
Glucose-Capillary: 116 mg/dL — ABNORMAL HIGH (ref 70–99)
Glucose-Capillary: 87 mg/dL (ref 70–99)

## 2018-04-28 MED ORDER — TRAMADOL HCL 50 MG PO TABS
50.0000 mg | ORAL_TABLET | ORAL | Status: DC | PRN
  Administered 2018-04-28: 50 mg via ORAL
  Filled 2018-04-28: qty 1

## 2018-04-28 MED ORDER — DOCUSATE SODIUM 100 MG PO CAPS
200.0000 mg | ORAL_CAPSULE | Freq: Every day | ORAL | Status: DC
  Administered 2018-04-29: 200 mg via ORAL
  Filled 2018-04-28: qty 2

## 2018-04-28 MED ORDER — MOVING RIGHT ALONG BOOK
Freq: Once | Status: AC
  Administered 2018-04-28: 07:00:00
  Filled 2018-04-28: qty 1

## 2018-04-28 MED ORDER — BISACODYL 5 MG PO TBEC: 10.0000 mg | DELAYED_RELEASE_TABLET | Freq: Every day | ORAL | Status: DC | PRN

## 2018-04-28 MED ORDER — OXYCODONE HCL 5 MG PO TABS
5.0000 mg | ORAL_TABLET | ORAL | Status: DC | PRN
  Administered 2018-04-28: 10 mg via ORAL

## 2018-04-28 MED ORDER — CARVEDILOL 6.25 MG PO TABS
6.2500 mg | ORAL_TABLET | Freq: Two times a day (BID) | ORAL | Status: DC
  Administered 2018-04-28 – 2018-04-29 (×3): 6.25 mg via ORAL
  Filled 2018-04-28 (×3): qty 1

## 2018-04-28 MED ORDER — FUROSEMIDE 40 MG PO TABS
40.0000 mg | ORAL_TABLET | Freq: Every day | ORAL | Status: DC
  Administered 2018-04-28 – 2018-04-29 (×2): 40 mg via ORAL
  Filled 2018-04-28 (×2): qty 1

## 2018-04-28 MED ORDER — ONDANSETRON HCL 4 MG/2ML IJ SOLN: 4.0000 mg | Freq: Four times a day (QID) | INTRAMUSCULAR | Status: DC | PRN

## 2018-04-28 MED ORDER — PANTOPRAZOLE SODIUM 40 MG PO TBEC
40.0000 mg | DELAYED_RELEASE_TABLET | Freq: Every day | ORAL | Status: DC
  Administered 2018-04-28 – 2018-04-29 (×2): 40 mg via ORAL
  Filled 2018-04-28 (×2): qty 1

## 2018-04-28 MED ORDER — SODIUM CHLORIDE 0.9% FLUSH: 3.0000 mL | INTRAVENOUS | Status: DC | PRN

## 2018-04-28 MED ORDER — ASPIRIN EC 325 MG PO TBEC
325.0000 mg | DELAYED_RELEASE_TABLET | Freq: Every day | ORAL | Status: DC
  Administered 2018-04-28 – 2018-04-29 (×2): 325 mg via ORAL
  Filled 2018-04-28 (×2): qty 1

## 2018-04-28 MED ORDER — ONDANSETRON HCL 4 MG PO TABS: 4.0000 mg | ORAL_TABLET | Freq: Four times a day (QID) | ORAL | Status: DC | PRN

## 2018-04-28 MED ORDER — ACETAMINOPHEN 325 MG PO TABS
650.0000 mg | ORAL_TABLET | Freq: Four times a day (QID) | ORAL | Status: DC | PRN
  Administered 2018-04-28 (×2): 650 mg via ORAL
  Filled 2018-04-28 (×2): qty 2

## 2018-04-28 MED ORDER — OXYCODONE HCL 5 MG PO TABS
5.0000 mg | ORAL_TABLET | ORAL | Status: DC | PRN
  Administered 2018-04-28 – 2018-04-29 (×2): 5 mg via ORAL
  Filled 2018-04-28 (×3): qty 1

## 2018-04-28 MED ORDER — SODIUM CHLORIDE 0.9 % IV SOLN: 250.0000 mL | INTRAVENOUS | Status: DC | PRN

## 2018-04-28 MED ORDER — POTASSIUM CHLORIDE CRYS ER 20 MEQ PO TBCR
20.0000 meq | EXTENDED_RELEASE_TABLET | Freq: Two times a day (BID) | ORAL | Status: DC
  Administered 2018-04-28 (×2): 20 meq via ORAL
  Filled 2018-04-28 (×2): qty 1

## 2018-04-28 MED ORDER — BISACODYL 10 MG RE SUPP: 10.0000 mg | Freq: Every day | RECTAL | Status: DC | PRN

## 2018-04-28 MED ORDER — SODIUM CHLORIDE 0.9% FLUSH
3.0000 mL | Freq: Two times a day (BID) | INTRAVENOUS | Status: DC
  Administered 2018-04-28 – 2018-04-29 (×3): 3 mL via INTRAVENOUS

## 2018-04-28 NOTE — Plan of Care (Signed)
  Problem: Education: Goal: Understanding of cardiac disease, CV risk reduction, and recovery process will improve Outcome: Progressing Goal: Individualized Educational Video(s) Outcome: Progressing   Problem: Cardiac: Goal: Ability to achieve and maintain adequate cardiovascular perfusion will improve Outcome: Progressing   Problem: Health Behavior/Discharge Planning: Goal: Ability to safely manage health-related needs after discharge will improve Outcome: Progressing   Problem: Education: Goal: Knowledge of General Education information will improve Description Including pain rating scale, medication(s)/side effects and non-pharmacologic comfort measures Outcome: Progressing   Problem: Health Behavior/Discharge Planning: Goal: Ability to manage health-related needs will improve Outcome: Progressing   Problem: Clinical Measurements: Goal: Ability to maintain clinical measurements within normal limits will improve Outcome: Progressing Goal: Will remain free from infection Outcome: Progressing Goal: Diagnostic test results will improve Outcome: Progressing Goal: Respiratory complications will improve Outcome: Progressing Goal: Cardiovascular complication will be avoided Outcome: Progressing   Problem: Activity: Goal: Risk for activity intolerance will decrease Outcome: Progressing   Problem: Coping: Goal: Level of anxiety will decrease Outcome: Progressing   Problem: Elimination: Goal: Will not experience complications related to bowel motility Outcome: Progressing Goal: Will not experience complications related to urinary retention Outcome: Progressing   Problem: Pain Managment: Goal: General experience of comfort will improve Outcome: Progressing   Problem: Safety: Goal: Ability to remain free from injury will improve Outcome: Progressing   Problem: Skin Integrity: Goal: Risk for impaired skin integrity will decrease Outcome: Progressing   

## 2018-04-28 NOTE — Progress Notes (Signed)
CARDIAC REHAB PHASE I    PRE:  Rate/Rhythm: 110 ST  MODE:  Ambulation: 100 ft 115 peak HR  POST:  Rate/Rhythm: 91 SR  BP:  Sitting: 107/78    SaO2: 95 RA   Pt helped to bathroom, then ambulated 165ft in hallway standby assist. Pt denies SOB, but felt "tired" at end of the walk. Pt returned to bed, call bell and phone within reach. Encouraged IS use and to walk for a third time later today. Will continue to follow and encourage walks.   [id number] Reynold Bowen, RN BSN 04/28/2018 1:28 PM

## 2018-04-28 NOTE — Plan of Care (Signed)
  Problem: Cardiac: Goal: Ability to achieve and maintain adequate cardiovascular perfusion will improve Outcome: Progressing   Problem: Clinical Measurements: Goal: Ability to maintain clinical measurements within normal limits will improve Outcome: Progressing Goal: Will remain free from infection Outcome: Progressing Goal: Diagnostic test results will improve Outcome: Progressing Goal: Respiratory complications will improve Outcome: Progressing Goal: Cardiovascular complication will be avoided Outcome: Progressing   Problem: Activity: Goal: Risk for activity intolerance will decrease Outcome: Progressing   Problem: Elimination: Goal: Will not experience complications related to bowel motility Outcome: Progressing Goal: Will not experience complications related to urinary retention Outcome: Progressing   Problem: Pain Managment: Goal: General experience of comfort will improve Outcome: Progressing   Problem: Safety: Goal: Ability to remain free from injury will improve Outcome: Progressing   Problem: Skin Integrity: Goal: Risk for impaired skin integrity will decrease Outcome: Progressing

## 2018-04-28 NOTE — Progress Notes (Signed)
4 Days Post-Op Procedure(s) (LRB): CORONARY ARTERY BYPASS GRAFTING (CABG) x3  using left internal mammary artery and right greater saphenous vein harvested endoscopically. (N/A) TRANSESOPHAGEAL ECHOCARDIOGRAM (TEE) (N/A) Subjective: Patient alert and oriented sitting up in chair Normal sinus rhythm Chest x-ray clear Mild expected postoperative anemia  Objective: Vital signs in last 24 hours: Temp:  [98.4 F (36.9 C)-98.6 F (37 C)] 98.4 F (36.9 C) (11/04 1025) Pulse Rate:  [76-95] 95 (11/04 1025) Cardiac Rhythm: Normal sinus rhythm (11/04 1029) Resp:  [14-27] 22 (11/04 1025) BP: (95-136)/(58-89) 136/89 (11/04 1025) SpO2:  [93 %-100 %] 95 % (11/04 1025) Weight:  [74.5 kg] 74.5 kg (11/04 0500)  Hemodynamic parameters for last 24 hours:  Stable  Intake/Output from previous day: 11/03 0701 - 11/04 0700 In: 40 [I.V.:40] Out: 951 [Urine:951] Intake/Output this shift: Total I/O In: 240 [P.O.:240] Out: -       Exam    General- alert and comfortable    Neck- no JVD, no cervical adenopathy palpable, no carotid bruit   Lungs- clear without rales, wheezes   Cor- regular rate and rhythm, no murmur , gallop   Abdomen- soft, non-tender   Extremities - warm, non-tender, minimal edema   Neuro- oriented, appropriate, no focal weakness   Lab Results: Recent Labs    04/27/18 0454 04/28/18 0903  WBC 7.4 6.3  HGB 8.7* 9.1*  HCT 29.6* 31.8*  PLT 135* 207   BMET:  Recent Labs    04/27/18 0454 04/28/18 0602  NA 135 135  K 3.9 4.4  CL 103 108  CO2 24 20*  GLUCOSE 77 95  BUN 11 12  CREATININE 0.55 0.67  CALCIUM 8.1* 8.4*    PT/INR: No results for input(s): LABPROT, INR in the last 72 hours. ABG    Component Value Date/Time   PHART 7.450 04/24/2018 2108   HCO3 25.7 04/24/2018 2108   TCO2 23 04/25/2018 1807   ACIDBASEDEF 1.0 04/24/2018 1824   O2SAT 100.0 04/24/2018 2108   CBG (last 3)  Recent Labs    04/26/18 0748 04/26/18 1215 04/27/18 0812  GLUCAP 77 107* 87     Assessment/Plan: S/P Procedure(s) (LRB): CORONARY ARTERY BYPASS GRAFTING (CABG) x3  using left internal mammary artery and right greater saphenous vein harvested endoscopically. (N/A) TRANSESOPHAGEAL ECHOCARDIOGRAM (TEE) (N/A) Transfer to floor Titrate medications and plan discharge in 1-2 days.   LOS: 10 days    [name] 04/28/2018

## 2018-04-28 NOTE — Progress Notes (Signed)
Progress Note  Patient Name: [name] Date of Encounter: 04/28/2018  Primary Cardiologist: Dr Wyline Mood  Subjective   Chest sore; no dyspnea  Inpatient Medications    Scheduled Meds: . aspirin EC  325 mg Oral Daily  . atorvastatin  40 mg Oral q1800  . carvedilol  6.25 mg Oral BID WC  . Chlorhexidine Gluconate Cloth  6 each Topical Q0600  . docusate sodium  200 mg Oral Daily  . furosemide  40 mg Oral Daily  . mouth rinse  15 mL Mouth Rinse BID  . mupirocin ointment  1 application Nasal BID  . pantoprazole  40 mg Oral QAC breakfast  . potassium chloride  20 mEq Oral BID  . sodium chloride flush  3 mL Intravenous Q12H   Continuous Infusions: . sodium chloride     PRN Meds: sodium chloride, acetaminophen, bisacodyl **OR** bisacodyl, ondansetron **OR** ondansetron (ZOFRAN) IV, oxyCODONE, sodium chloride flush, traMADol   Vital Signs    Vitals:   04/28/18 0400 04/28/18 0500 04/28/18 0600 04/28/18 0700  BP: 125/84 109/70  109/73  Pulse: 84 85 94 93  Resp: 16 17 (!) 22 (!) 21  Temp: 98.4 F (36.9 C)     TempSrc: Oral     SpO2: 94% 95% 99% 97%  Weight:  74.5 kg    Height:        Intake/Output Summary (Last 24 hours) at 04/28/2018 0720 Last data filed at 04/27/2018 2300 Gross per 24 hour  Intake 39.98 ml  Output 951 ml  Net -911.02 ml   Filed Weights   04/26/18 0458 04/27/18 0600 04/28/18 0500  Weight: 76.2 kg 74.6 kg 74.5 kg    Telemetry    Sinus with 3 beats NSVT - Personally Reviewed  Physical Exam   GEN: No acute distress.   Neck: No JVD Cardiac: RRR, no murmurs Respiratory: Mildly diminished BS bases GI: Soft, nontender, non-distended  MS: trace edema Neuro:  Nonfocal  Psych: Normal affect   Labs    Chemistry Recent Labs  Lab 04/26/18 0424 04/27/18 0454 04/28/18 0602  NA 135 135 135  K 4.0 3.9 4.4  CL 106 103 108  CO2 25 24 20*  GLUCOSE 84 77 95  BUN 10 11 12   CREATININE 0.67 0.55 0.67  CALCIUM 8.2* 8.1* 8.4*  GFRNONAA >60 >60 >60    GFRAA >60 >60 >60  ANIONGAP 4* 8 7     Hematology Recent Labs  Lab 04/25/18 1806 04/25/18 1807 04/26/18 0424 04/27/18 0454  WBC 10.1  --  9.2 7.4  RBC 3.79*  --  3.55* 3.33*  HGB 9.6* 10.2* 9.3* 8.7*  HCT 33.1* 30.0* 31.7* 29.6*  MCV 87.3  --  89.3 88.9  MCH 25.3*  --  26.2 26.1  MCHC 29.0*  --  29.3* 29.4*  RDW 17.3*  --  17.8* 18.1*  PLT 169  --  156 135*     Radiology    Dg Chest Port 1 View  Result Date: 04/27/2018 CLINICAL DATA:  History of CABG. EXAM: PORTABLE CHEST 1 VIEW COMPARISON:  04/26/2018 FINDINGS: Stable postsurgical changes from CABG. Stable position of right internal jugular approach venous sheath. Enlarged cardiac silhouette. Small bilateral pleural effusions with lower lobe atelectasis versus airspace consolidation. Minimal prominence of the interstitium. Osseous structures are without acute abnormality. Soft tissues are grossly normal. IMPRESSION: Small bilateral pleural effusions with lower lobe atelectasis versus airspace consolidation. Minimal pulmonary vascular congestion. Electronically Signed   By: Ulanda Edison.D.  On: 04/27/2018 08:06    Patient Profile     52 y.o. female admitted with non-ST elevation myocardial infarction.  Echocardiogram showed normal LV function and mild mitral regurgitation.  Cardiac catheterization revealed severe three-vessel coronary artery disease.  Patient is now status post coronary artery bypass graft.  Assessment & Plan    1 coronary artery disease status post coronary artery bypass and graft/non-ST elevation myocardial infarction-patient doing well postop. Holding sinus. Continue aspirin, statin and carvedilol.  2 postop volume excess-continue Lasix.  3 hyperlipidemia-continue statin.  Check lipids and liver 4 weeks following discharge.  4 HTN-continue coreg and follow.  5 carotid artery disease-follow-up carotid Dopplers October 2020.  For questions or updates, please contact [facility] Please  consult [email or web address] for contact info under        Signed, Olga Millers, MD  04/28/2018, 7:20 AM

## 2018-04-29 ENCOUNTER — Inpatient Hospital Stay (HOSPITAL_COMMUNITY): Payer: Medicaid Other

## 2018-04-29 LAB — CBC
HCT: 32.2 % — ABNORMAL LOW (ref 36.0–46.0)
Hemoglobin: 9.5 g/dL — ABNORMAL LOW (ref 12.0–15.0)
MCH: 26.2 pg (ref 26.0–34.0)
MCHC: 29.5 g/dL — ABNORMAL LOW (ref 30.0–36.0)
MCV: 88.7 fL (ref 80.0–100.0)
Platelets: 228 10*3/uL (ref 150–400)
RBC: 3.63 MIL/uL — ABNORMAL LOW (ref 3.87–5.11)
RDW: 19 % — ABNORMAL HIGH (ref 11.5–15.5)
WBC: 4.9 10*3/uL (ref 4.0–10.5)
nRBC: 0 % (ref 0.0–0.2)

## 2018-04-29 LAB — BASIC METABOLIC PANEL
Anion gap: 7 (ref 5–15)
BUN: 14 mg/dL (ref 6–20)
CO2: 25 mmol/L (ref 22–32)
Calcium: 8.7 mg/dL — ABNORMAL LOW (ref 8.9–10.3)
Chloride: 102 mmol/L (ref 98–111)
Creatinine, Ser: 0.77 mg/dL (ref 0.44–1.00)
GFR calc Af Amer: >60 mL/min (ref >60)
GFR calc non Af Amer: >60 mL/min (ref >60)
Glucose, Bld: 79 mg/dL (ref 70–99)
Potassium: 4.6 mmol/L (ref 3.5–5.1)
Sodium: 134 mmol/L — ABNORMAL LOW (ref 135–145)

## 2018-04-29 MED ORDER — FUROSEMIDE 40 MG PO TABS: 40.0000 mg | ORAL_TABLET | Freq: Every day | ORAL | 0 refills | Status: DC

## 2018-04-29 MED ORDER — POTASSIUM CHLORIDE CRYS ER 20 MEQ PO TBCR
20.0000 meq | EXTENDED_RELEASE_TABLET | Freq: Once | ORAL | Status: AC
  Administered 2018-04-29: 20 meq via ORAL
  Filled 2018-04-29: qty 1

## 2018-04-29 MED ORDER — ASPIRIN 325 MG PO TBEC: 325.0000 mg | DELAYED_RELEASE_TABLET | Freq: Every day | ORAL | 0 refills | Status: DC

## 2018-04-29 MED ORDER — MUPIROCIN 2 % EX OINT: 1.0000 "application " | TOPICAL_OINTMENT | Freq: Two times a day (BID) | CUTANEOUS | 0 refills | Status: DC

## 2018-04-29 MED ORDER — CARVEDILOL 6.25 MG PO TABS: 6.2500 mg | ORAL_TABLET | Freq: Two times a day (BID) | ORAL | 1 refills | Status: DC

## 2018-04-29 MED ORDER — ACETAMINOPHEN 325 MG PO TABS: 650.0000 mg | ORAL_TABLET | Freq: Four times a day (QID) | ORAL | Status: AC | PRN

## 2018-04-29 MED ORDER — ATORVASTATIN CALCIUM 40 MG PO TABS: 40.0000 mg | ORAL_TABLET | Freq: Every day | ORAL | 1 refills | Status: DC

## 2018-04-29 MED ORDER — OXYCODONE HCL 5 MG PO TABS: ORAL_TABLET | ORAL | 0 refills | Status: DC

## 2018-04-29 MED FILL — MUPIROCIN 2% OINTMENT: 2 | 10 days supply | Qty: 22 | Fill #0

## 2018-04-29 MED FILL — FUROSEMIDE 40 MG TABLET: 40 | 5 days supply | Qty: 5 | Fill #0

## 2018-04-29 MED FILL — CARVEDILOL 6.25 MG TABLET: 6.25 | 30 days supply | Qty: 60 | Fill #0 | Status: TO

## 2018-04-29 MED FILL — oxyCODONE HCL 5 MG TABS: 5 | 5 days supply | Qty: 30 | Fill #0

## 2018-04-29 MED FILL — ATORVASTATIN CALCIUM 80 MG: 80 | 30 days supply | Qty: 30 | Fill #0 | Status: TO

## 2018-04-29 NOTE — Progress Notes (Signed)
Progress Note  Patient Name: [name] Date of Encounter: 04/29/2018  Primary Cardiologist: Dr Jens Som  Subjective   No dyspnea or CP  Inpatient Medications    Scheduled Meds: . aspirin EC  325 mg Oral Daily  . atorvastatin  40 mg Oral q1800  . carvedilol  6.25 mg Oral BID WC  . Chlorhexidine Gluconate Cloth  6 each Topical Q0600  . docusate sodium  200 mg Oral Daily  . furosemide  40 mg Oral Daily  . mupirocin ointment  1 application Nasal BID  . pantoprazole  40 mg Oral QAC breakfast  . potassium chloride  20 mEq Oral Once  . sodium chloride flush  3 mL Intravenous Q12H   Continuous Infusions: . sodium chloride     PRN Meds: sodium chloride, acetaminophen, bisacodyl **OR** bisacodyl, ondansetron **OR** ondansetron (ZOFRAN) IV, oxyCODONE, sodium chloride flush, traMADol   Vital Signs    Vitals:   04/28/18 1218 04/28/18 1638 04/28/18 2000 04/29/18 0300  BP: 126/71  115/76 114/62  Pulse: 96   82  Resp: (!) 24  20 19   Temp: 99.2 F (37.3 C) 98.6 F (37 C) 98.2 F (36.8 C) 98.3 F (36.8 C)  TempSrc: Oral Oral Oral Oral  SpO2: 96%  98% 97%  Weight:    73.9 kg  Height:        Intake/Output Summary (Last 24 hours) at 04/29/2018 0834 Last data filed at 04/28/2018 1200 Gross per 24 hour  Intake 175 ml  Output -  Net 175 ml   Filed Weights   04/27/18 0600 04/28/18 0500 04/29/18 0300  Weight: 74.6 kg 74.5 kg 73.9 kg    Telemetry    Sinus  - Personally Reviewed  Physical Exam   GEN: No acute distress.  WDWN Neck: No JVD, supple Cardiac: RRR Respiratory: Mildly diminished BS bases; s/p sternotomy GI: Soft, NT ND MS: trace edema Neuro: Grossly intact    Labs    Chemistry Recent Labs  Lab 04/27/18 0454 04/28/18 0602 04/29/18 0318  NA 135 135 134*  K 3.9 4.4 4.6  CL 103 108 102  CO2 24 20* 25  GLUCOSE 77 95 79  BUN 11 12 14   CREATININE 0.55 0.67 0.77  CALCIUM 8.1* 8.4* 8.7*  GFRNONAA >60 >60 >60  GFRAA >60 >60 >60  ANIONGAP 8 7 7        Hematology Recent Labs  Lab 04/27/18 0454 04/28/18 0903 04/29/18 0318  WBC 7.4 6.3 4.9  RBC 3.33* 3.55* 3.63*  HGB 8.7* 9.1* 9.5*  HCT 29.6* 31.8* 32.2*  MCV 88.9 89.6 88.7  MCH 26.1 25.6* 26.2  MCHC 29.4* 28.6* 29.5*  RDW 18.1* 18.6* 19.0*  PLT 135* 207 228    Patient Profile     52 y.o. female admitted with non-ST elevation myocardial infarction.  Echocardiogram showed normal LV function and mild mitral regurgitation.  Cardiac catheterization revealed severe three-vessel coronary artery disease.  Patient is now status post coronary artery bypass graft.  Assessment & Plan    1 coronary artery disease status post coronary artery bypass and graft/non-ST elevation myocardial infarction-patient continues to do well this morning.  She remains in sinus rhythm.  Continue medical therapy with aspirin, statin and beta-blocker.    2 postop volume excess-continue present dose of Lasix.  3 hyperlipidemia-continue Lipitor.  Check lipids and liver 4 weeks following discharge.  4 HTN-blood pressure controlled on present medications.  5 carotid artery disease-follow-up carotid Dopplers October 2020.  CARDIOLOGY RECOMMENDATIONS:  Discharge is  anticipated in the next 48 hours. Recommendations for medications and follow up:  Discharge Medications: Continue medications as they are currently listed in the [facility].  Follow Up: The patient's Primary Cardiologist is Dr Jens Som  Follow up in the office in with APP 2-4 week(s) and me 3 months.  Signed,  Olga Millers, MD  8:36 AM 04/29/2018  [facility]  For questions or updates, please contact [facility] Please consult [email or web address] for contact info under        Signed, Olga Millers, MD  04/29/2018, 8:34 AM

## 2018-04-29 NOTE — Progress Notes (Signed)
D/c instructions given to pt. Medications delivered from pharmacy and wound care reviewed. CT sutures removed per order. IV removed clean and intact. Telemetry removed. Fiance to escort home.  Versie Starks, RN

## 2018-04-29 NOTE — Care Management Note (Signed)
Case Management Note Donn Pierini RN, BSN Transitions of Care Unit 4E- RN Case Manager [phone]  Patient Details  Name: [name] MRN: [id number] Date of Birth: [birth year]  Subjective/Objective:    Pt admitted with c/p, NSTEMI- s/p CABG                Action/Plan: PTA pt lived at home with spouse and family. Needs PCP and medication assistance- CM spoke with pt at bedside- per pt she is open to f/u with local clinics- call made to [facility] for f/u appointment- which is Nov. 13 at 920- info provided to pt. Also discussed DME needs with pt who is requesting RW and 3n1 for home- orders placed and call made to [facility] with [facility] for charity DME needs- RW and 3n1 to be delivered to room prior to discharge- offered [facility] pharmacy to pt who states she needs medications to be as low of cost as possible- pt is eligible for MATCH at [facility] will provide letter to use with [facility] pharmacy- will ask them to provide pt cost for pain medications- pt agreeable to have meds filled with [facility] pharmacy- bedside RN aware that [facility] will bring meds to bedside prior to discharge.   Expected Discharge Date:  04/29/18               Expected Discharge Plan:  Home/Self Care  In-House Referral:  NA  Discharge planning Services  CM Consult, Medication Assistance, Indigent Health Clinic, [facility], Follow-up appt scheduled  Post Acute Care Choice:  Durable Medical Equipment Choice offered to:  Patient  DME Arranged:  3-N-1, Walker rolling DME Agency:  [facility]  HH Arranged:  NA HH Agency:  NA  Status of Service:  Completed, signed off  If discussed at Long Length of Stay Meetings, dates discussed:    Discharge Disposition: home/self care   Additional Comments:  Darrold Span, RN 04/29/2018, 11:18 AM

## 2018-04-29 NOTE — Discharge Summary (Addendum)
Physician Discharge Summary       [address]       [name] [address]             [phone]    Patient ID: [name] MRN: [id number] DOB/AGE: 09/25/1965 52 y.o.  Admit date: 04/18/2018 Discharge date: 04/29/2018  Admission Diagnoses: 1. NSTEMI (non-ST elevated myocardial infarction) (HCC) 2. Coronary artery disease  Discharge Diagnoses:  1. S/P CABG x 3 2. ABL anemia 3. Hperlipidemia 4. 40-59% left internal carotid artery stenosis 5. History of hypertensions 6. Pre diabetes (HGA1C 5.9)  7. Acute systolic heart failure following NSTEMI Procedure (s):  LEFT HEART CATH AND CORONARY ANGIOGRAPHY by Dr. Herbie Baltimore on 04/21/2018:  Conclusion     There is mild left ventricular systolic dysfunction. The left ventricular ejection fraction is 45-50% by visual estimate. Inferior hypokinesis  LV end diastolic pressure is moderately elevated.  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 Prox RCA to Mid RCA lesion is 99% stenosed. Mid RCA to Dist RCA lesion is 100% stenosed.  Ost LAD lesion is 70% stenosed. Ost LAD to Prox LAD lesion is 55% stenosed. Prox LAD lesion is 80% stenosed. Mid LAD lesion is 70% stenosed.  Ost Cx to Prox Cx lesion is 70% stenosed. Prox Cx to Mid Cx lesion is 90% stenosed.   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 Reduced EF with inferior hypokinesis.  Moderately elevated LVEDP.     1.  Coronary artery bypass grafting x3 (left internal mammary artery to left anterior descending, saphenous vein graft to circumflex marginal, saphenous vein graft to right coronary artery, right ventricular marginal branch. 2.  Endoscopic harvest of right leg greater saphenous vein by Dr. Donata Clay on 04/24/2018.  History of Presenting  Illness: Patient is a 52 year old female referred to cardiothoracic G for consideration of CABG.  She has 3 vessel disease as described in the catheterization report below.  She presented to the emergency department on 04/18/2018 with chest pain.  She has a history of heart failure and hypertension as well as anemia.  The pain at presentation occurred intermittently over the past approximate 2 weeks.  The pain would be sharp in nature it was not associated with any specific activity.  On the date of presentation she developed symptoms approximately 3 hours after eating dinner.  Prior to 2 weeks ago she has not had any of the symptoms.  She also denied any associated dyspnea or other cardiac symptoms.  EKG in the emergency department showed left ventricular hypertrophy with ST depression and T wave inversion in the inferolateral leads.  She was noted to have an elevated troponin of 0.16 and also her hemoglobin was noted to be low at 8.0.  Reportedly she does have a history of anemia but the etiology is uncertain to her.  She was given nitroglycerin and morphine with improvement of her pain and cardiology consultation was obtained.  The patient has undergone full evaluation including cardiac catheterization and echocardiogram as described below.  Ejection fraction is noted to be in the 45 to 50% by visual estimate on the catheterization.  He does have moderately elevated LVEDP.  Dr. Donata Clay was asked to see the patient for consideration of coronary artery bypass grafting surgery for coronary artery disease. Potential risks benefits, and complications of the surgery were discussed with the patient and she agreed  to proceed with surgery. Pre operative carotid duplex US showed no significant right internal carotid artery stenosis and a 40-59% left internal carotid artery stenosis. Patient underwent a CABG x 3 on 04/24/2018.  Brief Hospital Course:  The patient was extubated the evening of surgery. She remained  afebrile and hemodynamically stable. Theone Murdoch, a line, chest tubes, and foley were removed early in the post operative course. Coreg was started and titrated accordingly. She was volume over loaded and diuresed. She had anemia. Last H and H was 9.5 and 32.2 . She was weaned off the insulin drip. The patient's HGA1C pre op was 5.9. She likely has pre diabetes. She was given nutrition information and she will need to follow up with a medical doctor for further surveillance. The patient was felt surgically stable for transfer from the ICU to [facility] for further convalescence on 04/28/2018. She continues to progress with cardiac rehab. She was ambulating on room air. She has been tolerating a diet and has had a bowel movement. Epicardial pacing wires were removed on 04/29/2018. Chest tube sutures will be removed the day of discharge. As discussed with Dr. Donata Clay, the patient is felt surgically stable for discharge today.    Latest Vital Signs: Blood pressure 122/78, pulse 83, temperature 98.1 F (36.7 C), temperature source Oral, resp. rate 16, height 4\' 6"  (1.372 m), weight 73.9 kg, SpO2 97 %.   Physical Exam: Cardiovascular: RRR Pulmonary: Slightly diminished at bases Abdomen: Soft, non tender, bowel sounds present. Extremities: Trace bilateral lower extremity edema. Wounds: Clean and dry.  No erythema or signs of infection.   Discharge Condition:Stable and discharged to home.  Recent laboratory studies:  Lab Results  Component Value Date   WBC 4.9 04/29/2018   HGB 9.5 (L) 04/29/2018   HCT 32.2 (L) 04/29/2018   MCV 88.7 04/29/2018   PLT 228 04/29/2018   Lab Results  Component Value Date   NA 134 (L) 04/29/2018   K 4.6 04/29/2018   CL 102 04/29/2018   CO2 25 04/29/2018   CREATININE 0.77 04/29/2018   GLUCOSE 79 04/29/2018      Diagnostic Studies: Dg Chest 2 View  Result Date: 04/29/2018 CLINICAL DATA:  Persistent mild mid chest pain since CABG on April 24, 2018. EXAM: CHEST  - 2 VIEW COMPARISON:  Portable chest x-ray of April 27, 2018 FINDINGS: The right lung is well-expanded. There has been interval resolution of right basilar atelectasis. Only a tiny amount of pleural fluid persists on the right. On the left there has been some improvement and basilar atelectasis or pneumonia. A small pleural effusion persists. The cardiac silhouette is mildly enlarged. The pulmonary vascularity is mildly engorged. The sternal wires are intact. The retrosternal soft tissues are normal. There is mild loss of height of T6 through T8 which is slightly more conspicuous today. IMPRESSION: Interval clearing of right basilar atelectasis. Persistent left basilar atelectasis or pneumonia. Small left pleural effusion and trace right pleural effusion remain. Persistent mild cardiomegaly with mild central pulmonary vascular congestion. Electronically Signed   By: David  Swaziland M.D.   On: 04/29/2018 08:32   Dg Chest 2 View  Result Date: 04/18/2018 CLINICAL DATA:  Pt states having medial chest pains for 2 weeks got severe today with SOB> Hx of HTN EXAM: CHEST - 2 VIEW COMPARISON:  None. FINDINGS: Heart size is normal. The lungs are free of focal consolidations and pleural effusions. No pulmonary edema. There is mild anterior wedge deformity of midthoracic vertebral bodies. IMPRESSION:  1.  No evidence for acute cardiopulmonary abnormality. 2. Midthoracic wedge compression fractures are probably chronic and warrant further evaluation. Recommend DXA scan if not performed recently. Electronically Signed   By: Norva Pavlov M.D.   On: 04/18/2018 19:04   Dg Chest Port 1 View  Result Date: 04/27/2018 CLINICAL DATA:  History of CABG. EXAM: PORTABLE CHEST 1 VIEW COMPARISON:  04/26/2018 FINDINGS: Stable postsurgical changes from CABG. Stable position of right internal jugular approach venous sheath. Enlarged cardiac silhouette. Small bilateral pleural effusions with lower lobe atelectasis versus airspace  consolidation. Minimal prominence of the interstitium. Osseous structures are without acute abnormality. Soft tissues are grossly normal. IMPRESSION: Small bilateral pleural effusions with lower lobe atelectasis versus airspace consolidation. Minimal pulmonary vascular congestion. Electronically Signed   By: Ted Mcalpine M.D.   On: 04/27/2018 08:06   Dg Chest Port 1 View  Result Date: 04/26/2018 CLINICAL DATA:  Post CABG, history hypertension EXAM: PORTABLE CHEST 1 VIEW COMPARISON:  Portable exam 0522 hours compared to 04/25/2018 FINDINGS: Interval removal of Swan-Ganz catheter, mediastinal drain and LEFT thoracostomy tube. Epicardial pacer wires again seen. RIGHT jugular central venous catheter with tip projecting over proximal SVC. Enlargement of cardiac silhouette post CABG. Prominent mediastinum consistent with preceding surgery. Bibasilar atelectasis. No acute infiltrate or pneumothorax. IMPRESSION: Enlargement of cardiac silhouette post CABG. Bibasilar atelectasis. No pneumothorax following LEFT chest tube removal. Electronically Signed   By: Ulyses Southward M.D.   On: 04/26/2018 07:42   Dg Chest Port 1 View  Result Date: 04/25/2018 CLINICAL DATA:  Chest tube with CABG EXAM: PORTABLE CHEST 1 VIEW COMPARISON:  04/24/2018 FINDINGS: Stable Swan-Ganz catheter positioning from the right. There has been interval tracheal and esophageal extubation. Stable thoracic drain positioning. Mild atelectasis. Increased heart size with lower volumes. There is chronic cardiomegaly. No visible pneumothorax. IMPRESSION: Lower volumes after extubation. Electronically Signed   By: Marnee Spring M.D.   On: 04/25/2018 08:17   Dg Chest Port 1 View  Result Date: 04/24/2018 CLINICAL DATA:  Postop from CABG surgery. EXAM: PORTABLE CHEST 1 VIEW COMPARISON:  04/18/2018 FINDINGS: Since the prior exam, CABG surgery has been performed. Standard lines and tubes are in place, including: An endotracheal tube with its tip 2.4 cm  above the Carina, a right internal jugular Swan-Ganz catheter with its tip in the main pulmonary artery, a nasal/orogastric tube passing below the diaphragm into the mid stomach, the mediastinal tube and a left chest tube, ladder with tip near the medial left apex. Cardiac silhouette is normal in size.  No mediastinal widening. Lungs are clear.  No gross pneumothorax on this semi-erect exam. IMPRESSION: 1. Status post CABG surgery. No evidence of an operative complication. No mediastinal widening, pulmonary edema or pneumothorax. 2. Support apparatus is well positioned. Electronically Signed   By: Amie Portland M.D.   On: 04/24/2018 15:04   Vas US Doppler Pre Cabg  Result Date: 04/23/2018 PREOPERATIVE VASCULAR EVALUATION  Indications: Pre CABG. Performing Technologist: Farrel Demark, I  Examination Guidelines: A complete evaluation includes B-mode imaging, spectral Doppler, color Doppler, and power Doppler as needed of all accessible portions of each vessel. Bilateral testing is considered an integral part of a complete examination. Limited examinations for reoccurring indications may be performed as noted.  Right Carotid Findings: +----------+--------+--------+--------+--------+------------------+           PSV cm/sEDV cm/sStenosisDescribeComments           +----------+--------+--------+--------+--------+------------------+ CCA Prox  124     18                                         +----------+--------+--------+--------+--------+------------------+  CCA Distal88      21                                         +----------+--------+--------+--------+--------+------------------+ ICA Prox  58      16      1-39%           intimal thickening +----------+--------+--------+--------+--------+------------------+ ICA Distal86      29                                         +----------+--------+--------+--------+--------+------------------+ ECA       129     11                                          +----------+--------+--------+--------+--------+------------------+ Portions of this table do not appear on this page. +----------+--------+-------+----------------+------------+           PSV cm/sEDV cmsDescribe        Arm Pressure +----------+--------+-------+----------------+------------+ Subclavian176            Multiphasic, [id number]          +----------+--------+-------+----------------+------------+ +---------+--------+--+--------+--+---------+ VertebralPSV cm/s75EDV cm/s26Antegrade +---------+--------+--+--------+--+---------+ Left Carotid Findings: +----------+--------+--------+--------+---------------------+--------+           PSV cm/sEDV cm/sStenosisDescribe             Comments +----------+--------+--------+--------+---------------------+--------+ CCA Prox  143     36                                            +----------+--------+--------+--------+---------------------+--------+ CCA Distal112     33                                            +----------+--------+--------+--------+---------------------+--------+ ICA Prox  215     60      40-59%  smooth and hypoechoic         +----------+--------+--------+--------+---------------------+--------+ ICA Mid   163     33                                            +----------+--------+--------+--------+---------------------+--------+ ICA Distal104     35                                            +----------+--------+--------+--------+---------------------+--------+ ECA       374     46      >50%    smooth and hypoechoic         +----------+--------+--------+--------+---------------------+--------+ +----------+--------+--------+----------------+------------+ SubclavianPSV cm/sEDV cm/sDescribe        Arm Pressure +----------+--------+--------+----------------+------------+           128             Multiphasic, [id number]           +----------+--------+--------+----------------+------------+ +---------+--------+--+--------+--+---------+ VertebralPSV cm/s86EDV  cm/s31Antegrade +---------+--------+--+--------+--+---------+  ABI Findings: +--------+------------------+-----+---------+--------+ Right   Rt Pressure (mmHg)IndexWaveform Comment  +--------+------------------+-----+---------+--------+ [id number]                    triphasic         +--------+------------------+-----+---------+--------+ +--------+------------------+-----+---------+-------+ Left    Lt Pressure (mmHg)IndexWaveform Comment +--------+------------------+-----+---------+-------+ [id number]                    triphasic        +--------+------------------+-----+---------+-------+  Right Doppler Findings: +--------+--------+-----+---------+--------+ Site    PressureIndexDoppler  Comments +--------+--------+-----+---------+--------+ [id number]          triphasic         +--------+--------+-----+---------+--------+ Radial               triphasic         +--------+--------+-----+---------+--------+ Ulnar                triphasic         +--------+--------+-----+---------+--------+  Left Doppler Findings: +--------+--------+-----+---------+--------+ Site    PressureIndexDoppler  Comments +--------+--------+-----+---------+--------+ [id number]          triphasic         +--------+--------+-----+---------+--------+ Radial               triphasic         +--------+--------+-----+---------+--------+ Ulnar                triphasic         +--------+--------+-----+---------+--------+  Summary: Right Carotid: Velocities in the right ICA are consistent with a 1-39% stenosis. Left Carotid: Velocities in the left ICA are consistent with a 40-59% stenosis.               Upper end of range. Right ABI: Pedal artery waveforms within normal limits. Left ABI: Pedal artery waveforms within normal limits.  Right Upper  Extremity: Doppler waveforms remain within normal limits with right radial compression. Doppler waveforms decrease >50% with right ulnar compression. Left Upper Extremity: Doppler waveforms decrease 50% with left radial compression. Doppler waveforms remain within normal limits with left ulnar compression.  Electronically signed by Gretta Began MD on 04/23/2018 at 3:36:00 PM.    Final          Discharge Medications: The patient has been discharged on:   1.Beta Blocker:  Yes [ x  ]                              No   [   ]                              If No, reason:  2.Ace Inhibitor/ARB: Yes [   ]                                     No  [  x  ]                                     If No, reason:Labile BP  3.Statin:   Yes [ x  ]                  No  [   ]  If No, reason:  4.Ecasa:  Yes  [ x  ]                  No   [   ]                  If No, reason:  Allergies as of 04/29/2018      Reactions   Pork-derived Products       Medication List    STOP taking these medications   aspirin 81 MG chewable tablet Replaced by:  aspirin 325 MG EC tablet   lisinopril 40 MG tablet Commonly known as:  PRINIVIL,ZESTRIL     TAKE these medications   acetaminophen 325 MG tablet Commonly known as:  TYLENOL Take 2 tablets (650 mg total) by mouth every 6 (six) hours as needed for mild pain.   aspirin 325 MG EC tablet Take 1 tablet (325 mg total) by mouth daily. Replaces:  aspirin 81 MG chewable tablet   atorvastatin 40 MG tablet Commonly known as:  LIPITOR Take 1 tablet (40 mg total) by mouth daily at 6 PM.   carvedilol 6.25 MG tablet Commonly known as:  COREG Take 1 tablet (6.25 mg total) by mouth 2 (two) times daily with a meal.   furosemide 40 MG tablet Commonly known as:  LASIX Take 1 tablet (40 mg total) by mouth daily. For 5 days then stop.   mupirocin ointment 2 % Commonly known as:  BACTROBAN Place 1 application into the nose 2 (two) times daily. For 2 days  then stop.   oxyCODONE 5 MG immediate release tablet Commonly known as:  Oxy IR/ROXICODONE Take 5 mg by mouth every 4-6 hours PRN severe pain            Durable Medical Equipment  (From admission, onward)         Start     Ordered   04/29/18 1033  For home use only DME Walker rolling  Once    Question:  Patient needs a walker to treat with the following condition  Answer:  Weakness generalized   04/29/18 1033   04/29/18 1033  For home use only DME 3 n 1  Once     04/29/18 1033          Follow Up Appointments: Follow-up Information    Donata Clay, Theron Arista, MD. Go on 05/28/2018.   Specialty:  Cardiothoracic Surgery Why:  PA/LAT CXR to be taken (at [facility] which is in the same building as Dr. Zenaida Niece Trigt's office) on  05/28/2018 at 1:30 pm;Appointment time is at 2:00 pm Contact information: [address] [employer] [address] [place] [address] [phone]        [facility]. Go on 05/07/2018.   Specialty:  Internal Medicine Why:  F/u appointment for primary care- at 9:20- please arrive 15 min early and bring ID and medication list- Please also follow HGA1C as is 5.9 (pre diabetes) Contact information: [address] [place] [address] [phone]       Jodelle Gross, NP. Go on 05/14/2018.   Specialties:  Nurse Practitioner, Radiology, Cardiology Why:  Appointment time is at 10:30 am Contact information: [address] [place] [address] [phone]           Signed: Noel Christmas 04/29/2018, 2:20 PM

## 2018-04-29 NOTE — Discharge Instructions (Signed)
Discharge Instructions:  1. You may shower, please wash incisions daily with soap and water and keep dry.  If you wish to cover wounds with dressing you may do so but please keep clean and change daily.  No tub baths or swimming until incisions have completely healed.  If your incisions become red or develop any drainage please call our office at [phone]  2. No Driving until cleared by Dr. Lucianne Lei Trigt's office and you are no longer using narcotic pain medications  3. Monitor your weight daily.. Please use the same scale and weigh at same time... If you gain 5-10 lbs in 48 hours with associated lower extremity swelling, please contact our office at [phone]  4. Fever of 101.5 for at least 24 hours with no source, please contact our office at [phone]  5. Activity- up as tolerated, please walk at least 3 times per day.  Avoid strenuous activity, no lifting, pushing, or pulling with your arms over 8-10 lbs for a minimum of 6 weeks  6. If any questions or concerns arise, please do not hesitate to contact our office at [phone]   Coronary Artery Bypass Grafting, Care After These instructions give you information on caring for yourself after your procedure. Your doctor may also give you more specific instructions. Call your doctor if you have any problems or questions after your procedure. Follow these instructions at home:  Only take medicine as told by your doctor. Take medicines exactly as told. Do not stop taking medicines or start any new medicines without talking to your doctor first.  Take your pulse as told by your doctor.  Do deep breathing as told by your doctor. Use your breathing device (incentive spirometer), if given, to practice deep breathing several times a day. Support your chest with a pillow or your arms when you take deep breaths or cough.  Keep the area clean, dry, and protected where the surgery cuts (incisions) were made. Remove bandages (dressings) only as  told by your doctor. If strips were applied to surgical area, do not take them off. They fall off on their own.  Check the surgery area daily for puffiness (swelling), redness, or leaking fluid.  If surgery cuts were made in your legs: ? Avoid crossing your legs. ? Avoid sitting for long periods of time. Change positions every 30 minutes. ? Raise your legs when you are sitting. Place them on pillows.  Wear stockings that help keep blood clots from forming in your legs (compression stockings).  Only take sponge baths until your doctor says it is okay to take showers. Pat the surgery area dry. Do not rub the surgery area with a washcloth or towel. Do not bathe, swim, or use a hot tub until your doctor says it is okay.  Eat foods that are high in fiber. These include raw fruits and vegetables, whole grains, beans, and nuts. Choose lean meats. Avoid canned, processed, and fried foods.  Drink enough fluids to keep your pee (urine) clear or pale yellow.  Weigh yourself every day.  Rest and limit activity as told by your doctor. You may be told to: ? Stop any activity if you have chest pain, shortness of breath, changes in heartbeat, or dizziness. Get help right away if this happens. ? Move around often for short amounts of time or take short walks as told by your doctor. Gradually become more active. You may need help to strengthen your muscles and build endurance. ? Avoid lifting, pushing, or pulling  anything heavier than 10 pounds (4.5 kg) for at least 6 weeks after surgery.  Do not drive until your doctor says it is okay.  Ask your doctor when you can go back to work.  Ask your doctor when you can begin sexual activity again.  Follow up with your doctor as told. Contact a doctor if:  You have puffiness, redness, more pain, or fluid draining from the incision site.  You have a fever.  You have puffiness in your ankles or legs.  You have pain in your legs.  You gain 2 or more  pounds (0.9 kg) a day.  You feel sick to your stomach (nauseous) or throw up (vomit).  You have watery poop (diarrhea). Get help right away if:  You have chest pain that goes to your jaw or arms.  You have shortness of breath.  You have a fast or irregular heartbeat.  You notice a "clicking" in your breastbone when you move.  You have numbness or weakness in your arms or legs.  You feel dizzy or light-headed. This information is not intended to replace advice given to you by your health care provider. Make sure you discuss any questions you have with your health care provider. Document Released: 06/16/2013 Document Revised: 11/17/2015 Document Reviewed: 11/18/2012 Elsevier Interactive Patient Education  2017 Elsevier Inc.   Preventing Type 2 Diabetes Mellitus Type 2 diabetes (type 2 diabetes mellitus) is a long-term (chronic) disease that affects blood sugar (glucose) levels. Normally, a hormone called insulin allows glucose to enter cells in the body. The cells use glucose for energy. In type 2 diabetes, one or both of these problems may be present:  The body does not make enough insulin.  The body does not respond properly to insulin that it makes (insulin resistance).  Insulin resistance or lack of insulin causes excess glucose to build up in the blood instead of going into cells. As a result, high blood glucose (hyperglycemia) develops, which can cause many complications. Being overweight or obese and having an inactive (sedentary) lifestyle can increase your risk for diabetes. Type 2 diabetes can be delayed or prevented by making certain nutrition and lifestyle changes. What nutrition changes can be made?  Eat healthy meals and snacks regularly. Keep a healthy snack with you for when you get hungry between meals, such as fruit or a handful of nuts.  Eat lean meats and proteins that are low in saturated fats, such as chicken, fish, egg whites, and beans. Avoid processed  meats.  Eat plenty of fruits and vegetables and plenty of grains that have not been processed (whole grains). It is recommended that you eat: ? 1?2 cups of fruit every day. ? 2?3 cups of vegetables every day. ? 6?8 oz of whole grains every day, such as oats, whole wheat, bulgur, brown rice, quinoa, and millet.  Eat low-fat dairy products, such as milk, yogurt, and cheese.  Eat foods that contain healthy fats, such as nuts, avocado, olive oil, and canola oil.  Drink water throughout the day. Avoid drinks that contain added sugar, such as soda or sweet tea.  Follow instructions from your health care provider about specific eating or drinking restrictions.  Control how much food you eat at a time (portion size). ? Check food labels to find out the serving sizes of foods. ? Use a kitchen scale to weigh amounts of foods.  Saute or steam food instead of frying it. Cook with water or broth instead of oils or butter.  Limit your intake of: ? Salt (sodium). Have no more than 1 tsp (2,400 mg) of sodium a day. If you have heart disease or high blood pressure, have less than ? tsp (1,500 mg) of sodium a day. ? Saturated fat. This is fat that is solid at room temperature, such as butter or fat on meat. What lifestyle changes can be made?  Activity  Do moderate-intensity physical activity for at least 30 minutes on at least 5 days of the week, or as much as told by your health care provider.  Ask your health care provider what activities are safe for you. A mix of physical activities may be best, such as walking, swimming, cycling, and strength training.  Try to add physical activity into your day. For example: ? Park in spots that are farther away than usual, so that you walk more. For example, park in a far corner of the parking lot when you go to the office or the grocery store. ? Take a walk during your lunch break. ? Use stairs instead of elevators or escalators. Weight Loss  Lose  weight as directed. Your health care provider can determine how much weight loss is best for you and can help you lose weight safely.  If you are overweight or obese, you may be instructed to lose at least 5?7 % of your body weight. Alcohol and Tobacco   Limit alcohol intake to no more than 1 drink a day for nonpregnant women and 2 drinks a day for men. One drink equals 12 oz of beer, 5 oz of wine, or 1 oz of hard liquor.  Do not use any tobacco products, such as cigarettes, chewing tobacco, and e-cigarettes. If you need help quitting, ask your health care provider. Work With Your Health Care Provider  Have your blood glucose tested regularly, as told by your health care provider.  Discuss your risk factors and how you can reduce your risk for diabetes.  Get screening tests as told by your health care provider. You may have screening tests regularly, especially if you have certain risk factors for type 2 diabetes.  Make an appointment with a diet and nutrition specialist (registered dietitian). A registered dietitian can help you make a healthy eating plan and can help you understand portion sizes and food labels. Why are these changes important?  It is possible to prevent or delay type 2 diabetes and related health problems by making lifestyle and nutrition changes.  It can be difficult to recognize signs of type 2 diabetes. The best way to avoid possible damage to your body is to take actions to prevent the disease before you develop symptoms. What can happen if changes are not made?  Your blood glucose levels may keep increasing. Having high blood glucose for a long time is dangerous. Too much glucose in your blood can damage your blood vessels, heart, kidneys, nerves, and eyes.  You may develop prediabetes or type 2 diabetes. Type 2 diabetes can lead to many chronic health problems and complications, such as: ? Heart disease. ? Stroke. ? Blindness. ? Kidney  disease. ? Depression. ? Poor circulation in the feet and legs, which could lead to surgical removal (amputation) in severe cases. Where to find support:  Ask your health care provider to recommend a registered dietitian, diabetes educator, or weight loss program.  Look for local or online weight loss groups.  Join a gym, fitness club, or outdoor activity group, such as a walking club. Where to  find more information: To learn more about diabetes and diabetes prevention, visit:  American Diabetes Association (ADA): www.diabetes.[employer] of Diabetes and Digestive and Kidney Diseases: ToyArticles.ca  To learn more about healthy eating, visit:  The U.S. Department of Agriculture Architect), Choose My Plate: http://yates.biz/  Office of Disease Prevention and Health Promotion (ODPHP), Dietary Guidelines: ListingMagazine.si  Summary  You can reduce your risk for type 2 diabetes by increasing your physical activity, eating healthy foods, and losing weight as directed.  Talk with your health care provider about your risk for type 2 diabetes. Ask about any blood tests or screening tests that you need to have. This information is not intended to replace advice given to you by your health care provider. Make sure you discuss any questions you have with your health care provider. Document Released: 10/03/2015 Document Revised: 11/17/2015 Document Reviewed: 08/02/2015 Elsevier Interactive Patient Education  [employer].

## 2018-04-29 NOTE — Progress Notes (Signed)
EPW removed per protocol. VSS. Tips intact. Pt educated in bedrest. Call light in reach. Will continue to monitor.  Versie Starks, RN

## 2018-04-29 NOTE — Progress Notes (Addendum)
      [address]       [name] [address]             [phone]        5 Days Post-Op Procedure(s) (LRB): CORONARY ARTERY BYPASS GRAFTING (CABG) x3  using left internal mammary artery and right greater saphenous vein harvested endoscopically. (N/A) TRANSESOPHAGEAL ECHOCARDIOGRAM (TEE) (N/A)  Subjective: Patient without specific complaints this am  Objective: Vital signs in last 24 hours: Temp:  [98.2 F (36.8 C)-99.2 F (37.3 C)] 98.3 F (36.8 C) (11/05 0300) Pulse Rate:  [82-96] 82 (11/05 0300) Cardiac Rhythm: Normal sinus rhythm (11/04 1900) Resp:  [18-24] 19 (11/05 0300) BP: (103-136)/(58-89) 114/62 (11/05 0300) SpO2:  [94 %-98 %] 97 % (11/05 0300) Weight:  [73.9 kg] 73.9 kg (11/05 0300)  Pre op weight 76 kg Current Weight  04/29/18 73.9 kg     Intake/Output from previous day: 11/04 0701 - 11/05 0700 In: 415 [P.O.:415] Out: -    Physical Exam:  Cardiovascular: RRR Pulmonary: Slightly diminished at bases Abdomen: Soft, non tender, bowel sounds present. Extremities: Trace bilateral lower extremity edema. Wounds: Clean and dry.  No erythema or signs of infection.  Lab Results: CBC: Recent Labs    04/28/18 0903 04/29/18 0318  WBC 6.3 4.9  HGB 9.1* 9.5*  HCT 31.8* 32.2*  PLT 207 228   BMET:  Recent Labs    04/28/18 0602 04/29/18 0318  NA 135 134*  K 4.4 4.6  CL 108 102  CO2 20* 25  GLUCOSE 95 79  BUN 12 14  CREATININE 0.67 0.77  CALCIUM 8.4* 8.7*    PT/INR:  Lab Results  Component Value Date   INR 1.28 04/24/2018   INR 1.08 04/23/2018   ABG:  INR: Will add last result for INR, ABG once components are confirmed Will add last 4 CBG results once components are confirmed  Assessment/Plan:  1. CV - SR in the 80's. On Carvedilol 6.25 mg bid. 2.  Pulmonary - On room air. CXR this am appears to show bibasilar atelectasis and small pleural effusions. Encourage incentive spirometer. 3. Volume Overload - On Lasix 40 mg  daily. 4.  Acute blood loss anemia - H and H stable at 9.5 and 32.2 5. Remove EPW 6. As discussed with Dr. Donata Clay, will discharge later today  [facility] ZimmermanPA-C 04/29/2018,7:21 AM [phone]

## 2018-04-29 NOTE — Progress Notes (Signed)
CARDIAC REHAB PHASE I   Pt on bedrest for EPW d/c. D/c ed completed with pt. Pt instructed to shower daily and monitor incisions. Pt states she has not looked at her incision yet, encouraged pt to do so before d/c. Encouraged continued IS use and sternal precautions. Pt given in-the-tube sheet and heart healthy diet. Reviewed restrictions and exercise guidelines. Will refer to CRP II [place].    [id number] Reynold Bowen, RN BSN 04/29/2018 9:18 AM

## 2018-05-01 MED FILL — Heparin Sodium (Porcine) Inj 1000 Unit/ML: INTRAMUSCULAR | Qty: 30 | Status: AC

## 2018-05-01 MED FILL — Cefuroxime Sodium For Inj 750 MG: INTRAMUSCULAR | Qty: 750 | Status: AC

## 2018-05-01 MED FILL — Potassium Chloride Inj 2 mEq/ML: INTRAVENOUS | Qty: 40 | Status: AC

## 2018-05-01 MED FILL — Magnesium Sulfate Inj 50%: INTRAMUSCULAR | Qty: 10 | Status: AC

## 2018-05-05 ENCOUNTER — Telehealth (HOSPITAL_COMMUNITY): Payer: Self-pay

## 2018-05-07 ENCOUNTER — Ambulatory Visit (INDEPENDENT_AMBULATORY_CARE_PROVIDER_SITE_OTHER): Payer: Self-pay | Admitting: Family Medicine

## 2018-05-07 ENCOUNTER — Encounter: Payer: Self-pay | Admitting: Family Medicine

## 2018-05-07 VITALS — BP 114/68 | HR 70 | Temp 97.9°F | Ht <= 58 in | Wt 154.6 lb

## 2018-05-07 DIAGNOSIS — R8290 Unspecified abnormal findings in urine: Secondary | ICD-10-CM

## 2018-05-07 DIAGNOSIS — Z09 Encounter for follow-up examination after completed treatment for conditions other than malignant neoplasm: Secondary | ICD-10-CM

## 2018-05-07 DIAGNOSIS — Z0000 Encounter for general adult medical examination without abnormal findings: Secondary | ICD-10-CM

## 2018-05-07 DIAGNOSIS — Z23 Encounter for immunization: Secondary | ICD-10-CM

## 2018-05-07 DIAGNOSIS — I252 Old myocardial infarction: Secondary | ICD-10-CM

## 2018-05-07 DIAGNOSIS — Z7689 Persons encountering health services in other specified circumstances: Secondary | ICD-10-CM

## 2018-05-07 DIAGNOSIS — D649 Anemia, unspecified: Secondary | ICD-10-CM

## 2018-05-07 LAB — POCT URINALYSIS DIP (MANUAL ENTRY)
Bilirubin, UA: NEGATIVE
Glucose, UA: NEGATIVE mg/dL
Ketones, POC UA: NEGATIVE mg/dL
Nitrite, UA: NEGATIVE
Protein Ur, POC: NEGATIVE mg/dL
Spec Grav, UA: 1.015 (ref 1.010–1.025)
Urobilinogen, UA: 0.2 E.U./dL
pH, UA: 5.5 (ref 5.0–8.0)

## 2018-05-07 NOTE — Progress Notes (Signed)
New Patient--Hospital Follow Up--Establish Care  Subjective:    Patient ID: [name], female    DOB: 07/31/1965, 52 y.o.   MRN: [id number]   Chief Complaint  Patient presents with  . Establish Care  . Hospitalization Follow-up    HPI   [name] is a year old female with a past medical history of NSTEMI, Hypertension, Hypocalcemia, and Anemia. [name] is here for Hospital follow up and to Establish Care.   Current Status: [name] is s/p: NSTEMI on 04/18/2018, left hear cath on 04/21/2018, and Coronary artery bypass grafting on 04/24/2018. Today [name] is doing well with/ no complaints. [name] has follow up appointment with Cardiologist on 05/28/2018. [name] denies visual changes, chest pain, cough, shortness of breath, heart palpitations, and falls. [name] has occasionally headaches and dizziness with position changes. Denies severe headaches, confusion, seizures, double vision, and blurred vision, nausea and vomiting.  [name] denies fevers, chills, recent infections, weight loss, and night sweats.  No reports of GI problems such as nausea, vomiting, diarrhea, and constipation. [name] has no reports of blood in stools, dysuria and hematuria. No depression or anxiety reported. [name] denies pain today.   Review of Systems  Constitutional: Negative.   HENT: Negative.   Eyes: Negative.   Respiratory: Negative.   Cardiovascular: Negative.        Moderate chest discomfort r/t recent heart surgery  Gastrointestinal: Positive for abdominal distention.  Endocrine: Negative.   Genitourinary: Negative.   Musculoskeletal: Negative.   Skin: Negative.   Allergic/Immunologic: Negative.        Seasonal allergies  Neurological: Positive for dizziness and headaches.  Psychiatric/Behavioral: Negative.    Objective:   Physical Exam  Constitutional: [name] is oriented to person, place, and time. [name] appears well-developed and well-nourished.  HENT:  Head: Normocephalic and atraumatic.  Right Ear: External ear normal.  Eyes:  Pupils are equal, round, and reactive to light. Conjunctivae and EOM are normal.  Neck: Normal range of motion. Neck supple.  Cardiovascular: Normal rate, regular rhythm, normal heart sounds and intact distal pulses.  Pulmonary/Chest: Effort normal and breath sounds normal.  Abdominal: Soft. Bowel sounds are normal.  Neurological: [name] is alert and oriented to person, place, and time.  Skin: Skin is warm and dry.  Psychiatric: [name] has a normal mood and affect. Her behavior is normal. Judgment and thought content normal.  Nursing note and vitals reviewed.  Assessment & Plan:   1. Hospital discharge follow-up  2. Encounter to establish care - POCT urinalysis dipstick  3. History of non-ST elevation myocardial infarction (NSTEMI) Stable. Blood pressure is 114/68 today. Chest wound is healing well. [name] will continue to follow up with Cardiologist as needed. [name] will continue to decrease high sodium intake, excessive alcohol intake, increase potassium intake, smoking cessation, and increase physical activity of at least 30 minutes of cardio activity daily. [name] will continue to follow Heart Healthy or DASH diet.  4. Hypocalcemia Calcium is at 8.7 on 04/29/2018. We will re-evaluate Calcium level today.  5. Anemia, unspecified type Hgb decreased at 9.5 on 04/29/2018.   6. Need for immunization against influenza - Flu Vaccine QUAD 36+ mos IM  7. Abnormal urinalysis - Urine Culture  8. Healthcare maintenance - Lipid Panel - TSH - CBC with Differential - Comprehensive metabolic panel  9. Follow up [name] will follow up in 3 months.   No orders of the defined types were placed in this encounter.  Raliegh IpNatalie Swayze Kozuch,  MSN, FNP-C [facility] [place], [place] [address] [phone]

## 2018-05-08 LAB — CBC WITH DIFFERENTIAL/PLATELET
Basophils Absolute: 0 10*3/uL (ref 0.0–0.2)
Basos: 1 %
EOS (ABSOLUTE): 0.1 10*3/uL (ref 0.0–0.4)
Eos: 2 %
Hematocrit: 34.9 % (ref 34.0–46.6)
Hemoglobin: 10.6 g/dL — ABNORMAL LOW (ref 11.1–15.9)
Immature Grans (Abs): 0 10*3/uL (ref 0.0–0.1)
Immature Granulocytes: 0 %
Lymphocytes Absolute: 1.4 10*3/uL (ref 0.7–3.1)
Lymphs: 24 %
MCH: 25.6 pg — ABNORMAL LOW (ref 26.6–33.0)
MCHC: 30.4 g/dL — ABNORMAL LOW (ref 31.5–35.7)
MCV: 84 fL (ref 79–97)
Monocytes Absolute: 0.5 10*3/uL (ref 0.1–0.9)
Monocytes: 8 %
Neutrophils Absolute: 3.9 10*3/uL (ref 1.4–7.0)
Neutrophils: 65 %
Platelets: 538 10*3/uL — ABNORMAL HIGH (ref 150–450)
RBC: 4.14 x10E6/uL (ref 3.77–5.28)
RDW: 18.5 % — ABNORMAL HIGH (ref 12.3–15.4)
WBC: 6 10*3/uL (ref 3.4–10.8)

## 2018-05-08 LAB — COMPREHENSIVE METABOLIC PANEL
ALT: 14 IU/L (ref 0–32)
AST: 22 IU/L (ref 0–40)
Albumin/Globulin Ratio: 0.8 — ABNORMAL LOW (ref 1.2–2.2)
Albumin: 3.6 g/dL (ref 3.5–5.5)
Alkaline Phosphatase: 85 IU/L (ref 39–117)
BUN/Creatinine Ratio: 20 (ref 9–23)
BUN: 16 mg/dL (ref 6–24)
Bilirubin Total: 0.4 mg/dL (ref 0.0–1.2)
CO2: 22 mmol/L (ref 20–29)
Calcium: 9.1 mg/dL (ref 8.7–10.2)
Chloride: 100 mmol/L (ref 96–106)
Creatinine, Ser: 0.79 mg/dL (ref 0.57–1.00)
GFR calc Af Amer: 101 mL/min/{1.73_m2} (ref >59)
GFR calc non Af Amer: 88 mL/min/{1.73_m2} (ref >59)
Globulin, Total: 4.5 g/dL (ref 1.5–4.5)
Glucose: 89 mg/dL (ref 65–99)
Potassium: 4.9 mmol/L (ref 3.5–5.2)
Sodium: 137 mmol/L (ref 134–144)
Total Protein: 8.1 g/dL (ref 6.0–8.5)

## 2018-05-08 LAB — LIPID PANEL
Chol/HDL Ratio: 3.4 ratio (ref 0.0–4.4)
Cholesterol, Total: 123 mg/dL (ref 100–199)
HDL: 36 mg/dL — ABNORMAL LOW (ref >39)
LDL Calculated: 74 mg/dL (ref 0–99)
Triglycerides: 63 mg/dL (ref 0–149)
VLDL Cholesterol Cal: 13 mg/dL (ref 5–40)

## 2018-05-08 LAB — TSH: TSH: 0.525 u[IU]/mL (ref 0.450–4.500)

## 2018-05-09 ENCOUNTER — Other Ambulatory Visit: Payer: Self-pay | Admitting: Family Medicine

## 2018-05-09 DIAGNOSIS — A498 Other bacterial infections of unspecified site: Secondary | ICD-10-CM

## 2018-05-09 LAB — URINE CULTURE

## 2018-05-09 MED ORDER — SULFAMETHOXAZOLE-TRIMETHOPRIM 800-160 MG PO TABS: 1.0000 | ORAL_TABLET | Freq: Two times a day (BID) | ORAL | 0 refills | Status: DC

## 2018-05-09 NOTE — Progress Notes (Signed)
Rx for Septra sent to pharmacy today.  

## 2018-05-12 ENCOUNTER — Telehealth: Payer: Self-pay

## 2018-05-12 NOTE — Telephone Encounter (Signed)
-----   Message from Natalie M Stroud, FNP sent at 05/09/2018  3:40 PM EST ----- Regarding: "Lab Results" Carrie,   Urine culture identified bacteria. Please inform patient that we have sent new Rx for Septra to pharmacy today. This antibiotic is specific to eliminate this bacteria. She is to take medication as directed. She is to complete all medication.   Thank you.   

## 2018-05-12 NOTE — Telephone Encounter (Signed)
Tried to contact patient no answer and vm

## 2018-05-14 ENCOUNTER — Ambulatory Visit: Payer: Self-pay | Admitting: Adult Health

## 2018-05-14 NOTE — Progress Notes (Deleted)
Cardiology Office Note   Date:  05/14/2018   ID:  [name], DOB May 29, 1966, MRN [id number]  PCP:  Kallie Locks, FNP  Cardiologist: Winfield Rast  No chief complaint on file.    History of Present Illness: [name] is a 52 y.o. female who presents for post hospital follow up after admission for NSTEMI. He underwent cardiac cath 04/22/2018 by Dr. Herbie Baltimore which revealed severe multivessel disease and was recommended for CABG. He underwent  3 vessel CABG LIMA to LAD, SVG to Cx marginal, SVG to RCA and right ventricular marginal branch. Right harvest of right leg greater SVG. Echo demonstrated EF of 45%-50%.   She has a history of CHF, HTN, and anemia. 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 She was noted to have a Hgb A1c of  5.9. Referred back to PCP for follow up on pre-diabetes. .  Past Medical History:  Diagnosis Date  . Anemia   . Hypertension   . Hypocalcemia   . NSTEMI (non-ST elevated myocardial infarction) [facility])     Past Surgical History:  Procedure Laterality Date  . CESAREAN SECTION    . CORONARY ARTERY BYPASS GRAFT N/A 04/24/2018   Procedure: CORONARY ARTERY BYPASS GRAFTING (CABG) x3  using left internal mammary artery and right greater saphenous vein harvested endoscopically.;  Surgeon: Kerin Perna, MD;  Location: [facility] OR;  Service: Open Heart Surgery;  Laterality: N/A;  . LEFT HEART CATH AND CORONARY ANGIOGRAPHY N/A 04/21/2018   Procedure: LEFT HEART CATH AND CORONARY ANGIOGRAPHY;  Surgeon: Marykay Lex, MD;  Location: [facility] INVASIVE CV LAB;  Service: Cardiovascular;  Laterality: N/A;  . TEE WITHOUT CARDIOVERSION N/A 04/24/2018   Procedure: TRANSESOPHAGEAL ECHOCARDIOGRAM (TEE);  Surgeon: Donata Clay, Theron Arista, MD;  Location: [facility] OR;  Service: Open Heart Surgery;  Laterality: N/A;     Current Outpatient Medications  Medication Sig Dispense Refill  .  acetaminophen (TYLENOL) 325 MG tablet Take 2 tablets (650 mg total) by mouth every 6 (six) hours as needed for mild pain.    Marland Kitchen atorvastatin (LIPITOR) 40 MG tablet Take 1 tablet (40 mg total) by mouth daily at 6 PM. 30 tablet 1  . carvedilol (COREG) 6.25 MG tablet Take 1 tablet (6.25 mg total) by mouth 2 (two) times daily with a meal. 60 tablet 1  . mupirocin ointment (BACTROBAN) 2 % Place 1 application into the nose 2 (two) times daily. For 2 days then stop. 22 g 0  . oxyCODONE (OXY IR/ROXICODONE) 5 MG immediate release tablet Take 5 mg by mouth every 4-6 hours PRN severe pain 30 tablet 0  . sulfamethoxazole-trimethoprim (BACTRIM DS,SEPTRA DS) 800-160 MG tablet Take 1 tablet by mouth 2 (two) times daily. 14 tablet 0   No current facility-administered medications for this visit.     Allergies:   Pork-derived products    Social History:  The patient  reports that she has never smoked. Her smokeless tobacco use includes chew. She reports that she drank alcohol.   Family History:  The patient's family history includes Hypertension in her father.    ROS: All other systems are reviewed and negative. Unless otherwise mentioned in H&P    PHYSICAL EXAM: VS:  LMP 04/15/2018  , BMI There is no height or weight on file to calculate BMI. GEN: Well nourished, well developed, in no acute distress HEENT: normal Neck: no JVD, carotid bruits, or masses Cardiac: ***RRR; no murmurs, rubs, or gallops,no  edema  Respiratory:  Clear to auscultation bilaterally, normal work of breathing GI: soft, nontender, nondistended, + BS MS: no deformity or atrophy Skin: warm and dry, no rash Neuro:  Strength and sensation are intact Psych: euthymic mood, full affect   EKG:  EKG {ACTION; IS/IS [id number]} ordered today. The ekg ordered today demonstrates ***   Recent Labs: 04/18/2018: B Natriuretic Peptide 287.6 04/25/2018: Magnesium 2.0 05/07/2018: ALT 14; BUN 16; Creatinine, Ser 0.79; Hemoglobin 10.6;  Platelets 538; Potassium 4.9; Sodium 137; TSH 0.525    Lipid Panel    Component Value Date/Time   CHOL 123 05/07/2018 1005   TRIG 63 05/07/2018 1005   HDL 36 (L) 05/07/2018 1005   CHOLHDL 3.4 05/07/2018 1005   CHOLHDL 3.8 04/19/2018 0406   VLDL 11 04/19/2018 0406   LDLCALC 74 05/07/2018 1005      Wt Readings from Last 3 Encounters:  05/07/18 154 lb 9.6 oz (70.1 kg)  04/29/18 162 lb 14.4 oz (73.9 kg)      Other studies Reviewed: Cardiac cath 04/21/2018     There is mild left ventricular systolic dysfunction. The left ventricular ejection fraction is 45-50% by visual estimate. Inferior hypokinesis  LV end diastolic pressure is moderately elevated.  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 Prox RCA to Mid RCA lesion is 99% stenosed. Mid RCA to Dist RCA lesion is 100% stenosed.  Ost LAD lesion is 70% stenosed. Ost LAD to Prox LAD lesion is 55% stenosed. Prox LAD lesion is 80% stenosed. Mid LAD lesion is 70% stenosed.  Ost Cx to Prox Cx lesion is 70% stenosed. Prox Cx to Mid Cx lesion is 90% stenosed.   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 Reduced EF with inferior hypokinesis.  Moderately elevated LVEDP.       ASSESSMENT AND PLAN:  1.  ***   Current medicines are reviewed at length with the patient today.    Labs/ tests ordered today include: *** Bettey MareKathryn M. Liborio NixonLawrence DNP, ANP, AACC   05/14/2018 7:23 AM    [facility] [address] Office [phone] Fax [phone]

## 2018-05-15 NOTE — Telephone Encounter (Signed)
-----   Message from Kallie LocksNatalie M Stroud, FNP sent at 05/09/2018  3:40 PM EST ----- Regarding: "Lab Results" Lyla SonCarrie,   Urine culture identified bacteria. Please inform patient that we have sent new Rx for Septra to pharmacy today. This antibiotic is specific to eliminate this bacteria. She is to take medication as directed. She is to complete all medication.   Thank you.

## 2018-05-15 NOTE — Telephone Encounter (Signed)
Unable to contact patient by phone and will send a letter for patient to contact office.

## 2018-05-19 NOTE — Progress Notes (Deleted)
Cardiology Office Note   Date:  05/19/2018   ID:  [name], DOB 06/22/1966, MRN [id number]  PCP:  Kallie Locks, FNP  Cardiologist: Winfield Rast  No chief complaint on file.    History of Present Illness: [name] is a 52 y.o. female who presents for post hospitalization follow up after admission for NSTEMI with subsequent cardiac cath on on 04/21/2018 demonstrating multivessel disease with 100% proximal RCA occlusion with left to right collaterals, severe tandem lesions in the proximal LAD and proximal LCx with reduced EF of 45%-50%. She has history of HTN, CHF, and anemia.   She had CABG 04/24/2018 by Dr. Morton Peters with LIMA to LAD, SVG to Cx, SVG to RCA, and right ventricular branch. She had uneventful recovery post operatively  She was established with PCP post discharge, Katherine Ip FNP and was treated for UTI.      Past Medical History:  Diagnosis Date  . Anemia   . Hypertension   . Hypocalcemia   . NSTEMI (non-ST elevated myocardial infarction) [facility])     Past Surgical History:  Procedure Laterality Date  . CESAREAN SECTION    . CORONARY ARTERY BYPASS GRAFT N/A 04/24/2018   Procedure: CORONARY ARTERY BYPASS GRAFTING (CABG) x3  using left internal mammary artery and right greater saphenous vein harvested endoscopically.;  Surgeon: Kerin Perna, MD;  Location: [facility] OR;  Service: Open Heart Surgery;  Laterality: N/A;  . LEFT HEART CATH AND CORONARY ANGIOGRAPHY N/A 04/21/2018   Procedure: LEFT HEART CATH AND CORONARY ANGIOGRAPHY;  Surgeon: Marykay Lex, MD;  Location: [facility] INVASIVE CV LAB;  Service: Cardiovascular;  Laterality: N/A;  . TEE WITHOUT CARDIOVERSION N/A 04/24/2018   Procedure: TRANSESOPHAGEAL ECHOCARDIOGRAM (TEE);  Surgeon: Donata Clay, Theron Arista, MD;  Location: [facility] OR;  Service: Open Heart Surgery;  Laterality: N/A;     Current Outpatient Medications  Medication Sig Dispense Refill  . acetaminophen (TYLENOL) 325 MG tablet Take 2 tablets (650 mg total)  by mouth every 6 (six) hours as needed for mild pain.    Marland Kitchen atorvastatin (LIPITOR) 40 MG tablet Take 1 tablet (40 mg total) by mouth daily at 6 PM. 30 tablet 1  . carvedilol (COREG) 6.25 MG tablet Take 1 tablet (6.25 mg total) by mouth 2 (two) times daily with a meal. 60 tablet 1  . mupirocin ointment (BACTROBAN) 2 % Place 1 application into the nose 2 (two) times daily. For 2 days then stop. 22 g 0  . oxyCODONE (OXY IR/ROXICODONE) 5 MG immediate release tablet Take 5 mg by mouth every 4-6 hours PRN severe pain 30 tablet 0  . sulfamethoxazole-trimethoprim (BACTRIM DS,SEPTRA DS) 800-160 MG tablet Take 1 tablet by mouth 2 (two) times daily. 14 tablet 0   No current facility-administered medications for this visit.     Allergies:   Pork-derived products    Social History:  The patient  reports that she has never smoked. Her smokeless tobacco use includes chew. She reports that she drank alcohol.   Family History:  The patient's family history includes Hypertension in her father.    ROS: All other systems are reviewed and negative. Unless otherwise mentioned in H&P    PHYSICAL EXAM: VS:  There were no vitals taken for this visit. , BMI There is no height or weight on file to calculate BMI. GEN: Well nourished, well developed, in no acute distress HEENT: normal Neck: no JVD, carotid bruits, or masses Cardiac: ***RRR; no murmurs, rubs, or gallops,no edema  Respiratory:  Clear to auscultation bilaterally, normal work of breathing GI: soft, nontender, nondistended, + BS MS: no deformity or atrophy Skin: warm and dry, no rash Neuro:  Strength and sensation are intact Psych: euthymic mood, full affect   EKG:  EKG {ACTION; IS/IS [id number]} ordered today. The ekg ordered today demonstrates ***   Recent Labs: 04/18/2018: B Natriuretic Peptide 287.6 04/25/2018: Magnesium 2.0 05/07/2018: ALT 14; BUN 16; Creatinine, Ser 0.79; Hemoglobin 10.6; Platelets 538; Potassium 4.9; Sodium 137; TSH  0.525    Lipid Panel    Component Value Date/Time   CHOL 123 05/07/2018 1005   TRIG 63 05/07/2018 1005   HDL 36 (L) 05/07/2018 1005   CHOLHDL 3.4 05/07/2018 1005   CHOLHDL 3.8 04/19/2018 0406   VLDL 11 04/19/2018 0406   LDLCALC 74 05/07/2018 1005      Wt Readings from Last 3 Encounters:  05/07/18 154 lb 9.6 oz (70.1 kg)  04/29/18 162 lb 14.4 oz (73.9 kg)      Other studies Reviewed: Conclusion     There is mild left ventricular systolic dysfunction. The left ventricular ejection fraction is 45-50% by visual estimate. Inferior hypokinesis  LV end diastolic pressure is moderately elevated.  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 Prox RCA to Mid RCA lesion is 99% stenosed. Mid RCA to Dist RCA lesion is 100% stenosed.  Ost LAD lesion is 70% stenosed. Ost LAD to Prox LAD lesion is 55% stenosed. Prox LAD lesion is 80% stenosed. Mid LAD lesion is 70% stenosed.  Ost Cx to Prox Cx lesion is 70% stenosed. Prox Cx to Mid Cx lesion is 90% stenosed.   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 Reduced EF with inferior hypokinesis.  Moderately elevated LVEDP.    Echo 04/20/2018 Left ventricle: The cavity size was normal. Wall thickness was   normal. Systolic function was normal. The estimated ejection   fraction was in the range of 55% to 65%. Wall motion was normal;   there were no regional wall motion abnormalities. - Mitral valve: There was mild regurgitation.  ASSESSMENT AND PLAN:  1.  ***   Current medicines are reviewed at length with the patient today.    Labs/ tests ordered today include: *** Katherine MareKathryn M. Liborio NixonLawrence DNP, ANP, [facility]   05/19/2018 8:53 AM    [facility] [address] Office [phone] Fax [phone]

## 2018-05-21 ENCOUNTER — Ambulatory Visit: Payer: Self-pay | Admitting: Adult Health

## 2018-05-27 ENCOUNTER — Other Ambulatory Visit: Payer: Self-pay | Admitting: Cardiothoracic Surgery

## 2018-05-27 DIAGNOSIS — Z951 Presence of aortocoronary bypass graft: Secondary | ICD-10-CM

## 2018-05-28 ENCOUNTER — Ambulatory Visit: Payer: Self-pay | Admitting: Cardiothoracic Surgery

## 2018-06-26 ENCOUNTER — Other Ambulatory Visit: Payer: Self-pay | Admitting: Physician Assistant

## 2018-07-02 ENCOUNTER — Other Ambulatory Visit: Payer: Self-pay | Admitting: Physician Assistant

## 2018-07-02 ENCOUNTER — Telehealth: Payer: Self-pay | Admitting: Adult Health

## 2018-07-02 MED ORDER — ATORVASTATIN CALCIUM 40 MG PO TABS: 40.0000 mg | ORAL_TABLET | Freq: Every day | ORAL | 3 refills | Status: DC

## 2018-07-02 MED ORDER — CARVEDILOL 6.25 MG PO TABS: 6.2500 mg | ORAL_TABLET | Freq: Two times a day (BID) | ORAL | 3 refills | Status: DC

## 2018-07-02 NOTE — Telephone Encounter (Signed)
Refill sent to the pharmacy electronically.  

## 2018-07-02 NOTE — Telephone Encounter (Signed)
 *  STAT* If patient is at the pharmacy, call can be transferred to refill team.   1. Which medications need to be refilled? (please list name of each medication and dose if known) atorvastatin (LIPITOR) 40 MG tablet, carvedilol (COREG) 6.25 MG tablet  2. Which pharmacy/location (including street and city if local pharmacy) is [facility]? [facility]   3. Do they need a 30 day or 90 day supply? 30

## 2018-07-09 ENCOUNTER — Other Ambulatory Visit: Payer: Self-pay

## 2018-07-09 ENCOUNTER — Encounter: Payer: Self-pay | Admitting: Cardiothoracic Surgery

## 2018-07-09 ENCOUNTER — Ambulatory Visit (INDEPENDENT_AMBULATORY_CARE_PROVIDER_SITE_OTHER): Payer: Self-pay | Admitting: Cardiothoracic Surgery

## 2018-07-09 ENCOUNTER — Ambulatory Visit
Admission: RE | Admit: 2018-07-09 | Discharge: 2018-07-09 | Disposition: A | Discharge status: Home / Self Care | Payer: No Typology Code available for payment source | Source: Ambulatory Visit | Attending: Cardiothoracic Surgery | Admitting: Cardiothoracic Surgery

## 2018-07-09 VITALS — BP 129/79 | HR 73 | Resp 16 | Ht <= 58 in | Wt 149.6 lb

## 2018-07-09 DIAGNOSIS — Z951 Presence of aortocoronary bypass graft: Secondary | ICD-10-CM

## 2018-07-09 DIAGNOSIS — I2510 Atherosclerotic heart disease of native coronary artery without angina pectoris: Secondary | ICD-10-CM

## 2018-07-09 NOTE — Progress Notes (Signed)
PCP is Kallie Locks, FNP Referring Provider is Antoine Poche, MD  Chief Complaint  Patient presents with  . Routine Post Op    f/u with CXR HX of CABG 04/24/18    HPI: Patient returns for scheduled postop visit after urgent CABG x3 April 25, 2018 for unstable angina and mild-moderate LV dysfunction.  The patient has done well.  Surgical incisions are healed.  Postoperative chest x-ray today is clear.  She has had no recurrent angina or symptoms of CHF.  She is currently taking aspirin, Lipitor, and carvedilol.  She has not yet been seen for post CABG cardiology visit but this will be arranged.  We discussed her activity limitations, return to work, medications, and wound care.  I reviewed her chest x-ray images and discussed the findings with the patient.   Past Medical History:  Diagnosis Date  . Anemia   . Hypertension   . Hypocalcemia   . NSTEMI (non-ST elevated myocardial infarction) [facility])     Past Surgical History:  Procedure Laterality Date  . CESAREAN SECTION    . CORONARY ARTERY BYPASS GRAFT N/A 04/24/2018   Procedure: CORONARY ARTERY BYPASS GRAFTING (CABG) x3  using left internal mammary artery and right greater saphenous vein harvested endoscopically.;  Surgeon: Kerin Perna, MD;  Location: [facility] OR;  Service: Open Heart Surgery;  Laterality: N/A;  . LEFT HEART CATH AND CORONARY ANGIOGRAPHY N/A 04/21/2018   Procedure: LEFT HEART CATH AND CORONARY ANGIOGRAPHY;  Surgeon: Marykay Lex, MD;  Location: [facility] INVASIVE CV LAB;  Service: Cardiovascular;  Laterality: N/A;  . TEE WITHOUT CARDIOVERSION N/A 04/24/2018   Procedure: TRANSESOPHAGEAL ECHOCARDIOGRAM (TEE);  Surgeon: Donata Clay, Theron Arista, MD;  Location: [facility] OR;  Service: Open Heart Surgery;  Laterality: N/A;    Family History  Problem Relation Age of Onset  . Hypertension Father     Social History Social History   Tobacco Use  . Smoking status: Never Smoker  . Smokeless tobacco: Current User    Types:  Chew  Substance Use Topics  . Alcohol use: Not Currently    Frequency: Never  . Drug use: Not on file    Current Outpatient Medications  Medication Sig Dispense Refill  . acetaminophen (TYLENOL) 325 MG tablet Take 2 tablets (650 mg total) by mouth every 6 (six) hours as needed for mild pain.    Marland Kitchen atorvastatin (LIPITOR) 40 MG tablet Take 1 tablet (40 mg total) by mouth daily at 6 PM. 90 tablet 3  . carvedilol (COREG) 6.25 MG tablet Take 1 tablet (6.25 mg total) by mouth 2 (two) times daily with a meal. 180 tablet 3   No current facility-administered medications for this visit.     Allergies  Allergen Reactions  . 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- return to work document provided  BP 129/79 (BP Location: Right Arm, Patient Position: Sitting, Cuff Size: Large)   Pulse 73   Resp 16   Ht 4\' 6"  (1.372 m)   Wt 149 lb 9.6 oz (67.9 kg)   SpO2 97% Comment: RA  BMI 36.07 kg/m  Physical Exam      Exam    General- alert and comfortable    Neck- no JVD, no cervical adenopathy palpable, no carotid bruit   Lungs- clear without rales, wheezes   Cor- regular rate and rhythm, no murmur ,  gallop   Abdomen- soft, non-tender   Extremities - warm, non-tender, minimal edema   Neuro- oriented, appropriate, no focal weakness   Diagnostic Tests: Chest x-ray clear  Impression: Doing well after urgent CABG x3. We discussed the importance of compliance with her medications. We discussed the components and importance of heart healthy diet and heart healthy lifestyle.  Plan: She will drive and lift up to  15 pounds until February 1.  At that time she can lift heavier and plan on returning to work in mid February, over 3 months after surgery.   Mikey Bussing, MD [facility] [phone]

## 2018-07-25 ENCOUNTER — Ambulatory Visit: Payer: Self-pay | Admitting: Family Medicine

## 2018-08-07 ENCOUNTER — Encounter: Payer: Self-pay | Admitting: Interventional Cardiology

## 2018-08-08 ENCOUNTER — Ambulatory Visit: Payer: Self-pay | Admitting: Family Medicine

## 2018-08-13 ENCOUNTER — Ambulatory Visit: Payer: Self-pay | Admitting: Family Medicine

## 2018-09-04 ENCOUNTER — Ambulatory Visit: Payer: Self-pay | Admitting: Interventional Cardiology

## 2018-10-17 ENCOUNTER — Telehealth: Payer: Self-pay | Admitting: Interventional Cardiology

## 2018-10-17 NOTE — Telephone Encounter (Signed)
Attempted to contact pt about appt with Dr. Katrinka Blazing on 4/30.  Left message to call back.  Needs to be changed to a virtual visit with [employer] if possible, otherwise telephone visit.  Was going to offer 12:30P, 1P, or 2:30P, whichever is available.

## 2018-10-20 NOTE — Telephone Encounter (Signed)
Left message on voicemail for patient to call back and switch appt from 4/30 220p to 4/30 230p and instead of being office visit , make it a virtual visit, ask patient if they want to sign up for mychart and get verbal consent for appt.

## 2018-10-21 NOTE — Telephone Encounter (Signed)
LM for pt to call back to switch her appt to a virtual visit at 2:30 on 4/30. Also need consent.

## 2018-10-22 NOTE — Telephone Encounter (Signed)
lmom to call back regarding appt for 10/23/18 with Dr Verdis Prime , need to change to virtual visiti and get consent

## 2018-10-23 ENCOUNTER — Ambulatory Visit: Payer: Self-pay | Admitting: Interventional Cardiology

## 2018-10-23 NOTE — Telephone Encounter (Signed)
Pt never answered or returned calls about appt.

## 2018-10-23 NOTE — Telephone Encounter (Signed)
Called pt for her appt today with Dr Katrinka Blazing. Call went to VM.

## 2019-07-15 ENCOUNTER — Telehealth: Payer: Self-pay | Admitting: Interventional Cardiology

## 2019-07-15 NOTE — Telephone Encounter (Signed)
*  STAT* If patient is at the pharmacy, call can be transferred to refill team.   1. Which medications need to be refilled? (please list name of each medication and dose if known) atorvastatin (LIPITOR) 40 MG tablet  2. Which pharmacy/location (including street and city if local pharmacy) is medication to be sent to? [facility] [address]  3. Do they need a 30 day or 90 day supply? 30 day  Patient is out of medication

## 2019-07-15 NOTE — Telephone Encounter (Signed)
I tried to call the pt back, but the phone is not in order. Pt has never been seen in our office before.

## 2019-07-20 ENCOUNTER — Other Ambulatory Visit: Payer: Self-pay | Admitting: Interventional Cardiology

## 2019-07-20 ENCOUNTER — Other Ambulatory Visit: Payer: Self-pay

## 2019-07-20 ENCOUNTER — Encounter: Payer: Self-pay | Admitting: Interventional Cardiology

## 2019-07-20 MED ORDER — ATORVASTATIN CALCIUM 40 MG PO TABS: 40.0000 mg | ORAL_TABLET | Freq: Every day | ORAL | 3 refills | Status: DC

## 2019-07-20 NOTE — Telephone Encounter (Signed)
*  STAT* If patient is at the pharmacy, call can be transferred to refill team.   1. Which medications need to be refilled? (please list name of each medication and dose if known) atorvastatin (LIPITOR) 40 MG tablet  2. Which pharmacy/location (including street and city if local pharmacy) is medication to be sent to? [facility] [address]  3. Do they need a 30 day or 90 day supply? 30 days  Patient is out of medication

## 2019-07-20 NOTE — Telephone Encounter (Signed)
error 

## 2019-07-21 ENCOUNTER — Other Ambulatory Visit: Payer: Self-pay | Admitting: Adult Health

## 2019-07-21 MED ORDER — CARVEDILOL 6.25 MG PO TABS: 6.2500 mg | ORAL_TABLET | Freq: Two times a day (BID) | ORAL | 0 refills | Status: DC

## 2019-07-21 MED ORDER — ATORVASTATIN CALCIUM 40 MG PO TABS: 40.0000 mg | ORAL_TABLET | Freq: Every day | ORAL | 0 refills | Status: DC

## 2019-07-21 NOTE — Telephone Encounter (Signed)
°*  STAT* If patient is at the pharmacy, call can be transferred to refill team.   1. Which medications need to be refilled? (please list name of each medication and dose if known)  atorvastatin (LIPITOR) 40 MG tablet carvedilol (COREG) 6.25 MG tablet  2. Which pharmacy/location (including street and city if local pharmacy) is medication to be sent to?  [facility] [place], [place] - [address]  3. Do they need a 30 day or 90 day supply? 90   [facility], but wanted to let our office know in case there were any issues

## 2019-09-04 ENCOUNTER — Telehealth: Payer: Self-pay | Admitting: Internal Medicine

## 2019-09-04 MED ORDER — ATORVASTATIN CALCIUM 40 MG PO TABS: 40.0000 mg | ORAL_TABLET | Freq: Every day | ORAL | 0 refills | Status: DC

## 2019-09-04 MED ORDER — CARVEDILOL 6.25 MG PO TABS: 6.2500 mg | ORAL_TABLET | Freq: Two times a day (BID) | ORAL | 0 refills | Status: DC

## 2019-09-04 NOTE — Telephone Encounter (Signed)
*  STAT* If patient is at the pharmacy, call can be transferred to refill team.   1. Which medications need to be refilled? (please list name of each medication and dose if known) carvedilol (COREG) 6.25 MG tablet / atorvastatin (LIPITOR) 40 MG tablet  2. Which pharmacy/location (including street and city if local pharmacy) is medication to be sent to? [facility] - [place], [place] - [address]  3. Do they need a 30 day or 90 day supply?  30  Patient is out of medication. She is scheduled with [name] on 10/05/19. She states that she has no insurance that's why she did not want to schedule an appt.

## 2019-09-04 NOTE — Telephone Encounter (Signed)
Left message informing patient Rx for Atorvastatin and Carvedilol were called into [facility] pharmacy.

## 2019-09-08 ENCOUNTER — Telehealth: Payer: Self-pay | Admitting: Licensed Clinical Social Worker

## 2019-09-08 NOTE — Telephone Encounter (Signed)
CSW received referral to assist patient with resources as she is uninsured. CSW attempted to reach patient with no answer. Message left for return call. Lasandra Beech, LCSW, CCSW-MCS [phone]

## 2019-09-17 MED FILL — ATORVASTATIN CALCIUM 40 MG: 40 | 30 days supply | Qty: 30 | Fill #0

## 2019-10-04 NOTE — Progress Notes (Deleted)
Cardiology Office Note   Date:  10/04/2019   ID:  [name], DOB 1966/05/11, MRN [id number]  PCP:  Azzie Glatter, FNP  Cardiologist:  Dr. Stanford Breed No chief complaint on file.    History of Present Illness: [name] is a 54 y.o. female who presents for medication refills. She has not been seen by cardiology since 04/29/2018. She was seen in the hospital in the setting of non-ST elevation myocardial infarction. At that time cardiac catheterization revealed three-vessel coronary artery disease, and she underwent coronary artery bypass grafting.  Other history includes hyperlipidemia, hypertension, and carotid artery disease. She called our office on 09/04/2019 for medication refills. As she had not been seen in over a year appointment was made today.    Past Medical History:  Diagnosis Date  . Anemia   . Hypertension   . Hypocalcemia   . NSTEMI (non-ST elevated myocardial infarction) [facility])     Past Surgical History:  Procedure Laterality Date  . CESAREAN SECTION    . CORONARY ARTERY BYPASS GRAFT N/A 04/24/2018   Procedure: CORONARY ARTERY BYPASS GRAFTING (CABG) x3  using left internal mammary artery and right greater saphenous vein harvested endoscopically.;  Surgeon: Ivin Poot, MD;  Location: [place];  Service: Open Heart Surgery;  Laterality: N/A;  . LEFT HEART CATH AND CORONARY ANGIOGRAPHY N/A 04/21/2018   Procedure: LEFT HEART CATH AND CORONARY ANGIOGRAPHY;  Surgeon: Leonie Man, MD;  Location: [place] [facility];  Service: Cardiovascular;  Laterality: N/A;  . TEE WITHOUT CARDIOVERSION N/A 04/24/2018   Procedure: TRANSESOPHAGEAL ECHOCARDIOGRAM (TEE);  Surgeon: Prescott Gum, Collier Salina, MD;  Location: [place];  Service: Open Heart Surgery;  Laterality: N/A;     Current Outpatient Medications  Medication Sig Dispense Refill  . acetaminophen (TYLENOL) 325 MG tablet Take 2 tablets (650 mg total) by mouth every 6 (six) hours as needed for mild pain.    Marland Kitchen atorvastatin  (LIPITOR) 40 MG tablet Take 1 tablet (40 mg total) by mouth daily at 6 PM. Please keep appt for further refills. 2nd attempt. 30 tablet 0  . carvedilol (COREG) 6.25 MG tablet Take 1 tablet (6.25 mg total) by mouth 2 (two) times daily with a meal. Please keep appt for further refills. 2nd attempt. 60 tablet 0   No current facility-administered medications for this visit.    Allergies:   Pork-derived products    Social History:  The patient  reports that she has never smoked. Her smokeless tobacco use includes chew. She reports previous alcohol use.   Family History:  The patient's family history includes Hypertension in her father.    ROS: All other systems are reviewed and negative. Unless otherwise mentioned in H&P    PHYSICAL EXAM: VS:  There were no vitals taken for this visit. , BMI There is no height or weight on file to calculate BMI. GEN: Well nourished, well developed, in no acute distress HEENT: normal Neck: no JVD, carotid bruits, or masses Cardiac: ***RRR; no murmurs, rubs, or gallops,no edema  Respiratory:  Clear to auscultation bilaterally, normal work of breathing GI: soft, nontender, nondistended, + BS MS: no deformity or atrophy Skin: warm and dry, no rash Neuro:  Strength and sensation are intact Psych: euthymic mood, full affect   EKG:  EKG {ACTION; IS/IS [id number]} ordered today. The ekg ordered today demonstrates ***   Recent Labs: No results found for requested labs within last 8760 hours.    Lipid Panel    Component Value Date/Time  CHOL 123 05/07/2018 1005   TRIG 63 05/07/2018 1005   HDL 36 (L) 05/07/2018 1005   CHOLHDL 3.4 05/07/2018 1005   CHOLHDL 3.8 04/19/2018 0406   VLDL 11 04/19/2018 0406   LDLCALC 74 05/07/2018 1005      Wt Readings from Last 3 Encounters:  07/09/18 149 lb 9.6 oz (67.9 kg)  05/07/18 154 lb 9.6 oz (70.1 kg)  04/29/18 162 lb 14.4 oz (73.9 kg)      Other studies Reviewed: [facility] 04/21/2018    There is mild  left ventricular systolic dysfunction. The left ventricular ejection fraction is 45-50% by visual estimate. Inferior hypokinesis  LV end diastolic pressure is moderately elevated.  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 Prox RCA to Mid RCA lesion is 99% stenosed. Mid RCA to Dist RCA lesion is 100% stenosed.  Ost LAD lesion is 70% stenosed. Ost LAD to Prox LAD lesion is 55% stenosed. Prox LAD lesion is 80% stenosed. Mid LAD lesion is 70% stenosed.  Ost Cx to Prox Cx lesion is 70% stenosed. Prox Cx to Mid Cx lesion is 90% stenosed.   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 Reduced EF with inferior hypokinesis.  Moderately elevated LVEDP.     ASSESSMENT AND PLAN:  1.  ***   Current medicines are reviewed at length with the patient today.  I have spent *** dedicated to the care of this patient on the date of this encounter to include pre-visit review of records, assessment, management and diagnostic testing,with shared decision making.  Labs/ tests ordered today include: *** Bettey Mare. Liborio Nixon, ANP, AACC   10/04/2019 1:48 PM    [facility] [address] Office [phone] Fax [phone]  Notice: This dictation was prepared with Dragon dictation along with smaller phrase technology. Any transcriptional errors that result from this process are unintentional and may not be corrected upon review.

## 2019-10-05 ENCOUNTER — Ambulatory Visit: Payer: Self-pay | Admitting: Adult Health

## 2019-10-09 MED FILL — ATORVASTATIN CALCIUM 40 MG: 40 | 30 days supply | Qty: 30 | Fill #0

## 2019-11-09 MED FILL — ATORVASTATIN CALCIUM 40 MG: 40 | 30 days supply | Qty: 30 | Fill #1

## 2019-11-10 ENCOUNTER — Telehealth: Payer: Self-pay | Admitting: Interventional Cardiology

## 2019-11-10 MED ORDER — CARVEDILOL 6.25 MG PO TABS: 6.2500 mg | ORAL_TABLET | Freq: Two times a day (BID) | ORAL | 0 refills | Status: DC

## 2019-11-10 MED FILL — CARVEDILOL 6.25 MG TABLET: 6.25 | 30 days supply | Qty: 60 | Fill #0

## 2019-11-10 NOTE — Telephone Encounter (Signed)
New message   Pt c/o medication issue:  1. Name of Medication: carvedilol (COREG) 6.25 MG tablet  2. How are you currently taking this medication (dosage and times per day)? As written  3. Are you having a reaction (difficulty breathing--STAT)? No   4. What is your medication issue?patient needs a new prescription for medication sent to [facility], [facility] [place]

## 2019-12-09 IMAGING — DX DG CHEST 2V
2 series · 2 of 2 positions shown · non-contrast
Comparison: Portable chest x-ray April 27, 2018

CLINICAL DATA: Persistent mild mid chest pain since CABG on April 24, 2018.

EXAM:
CHEST - 2 VIEW

[w chest pa]
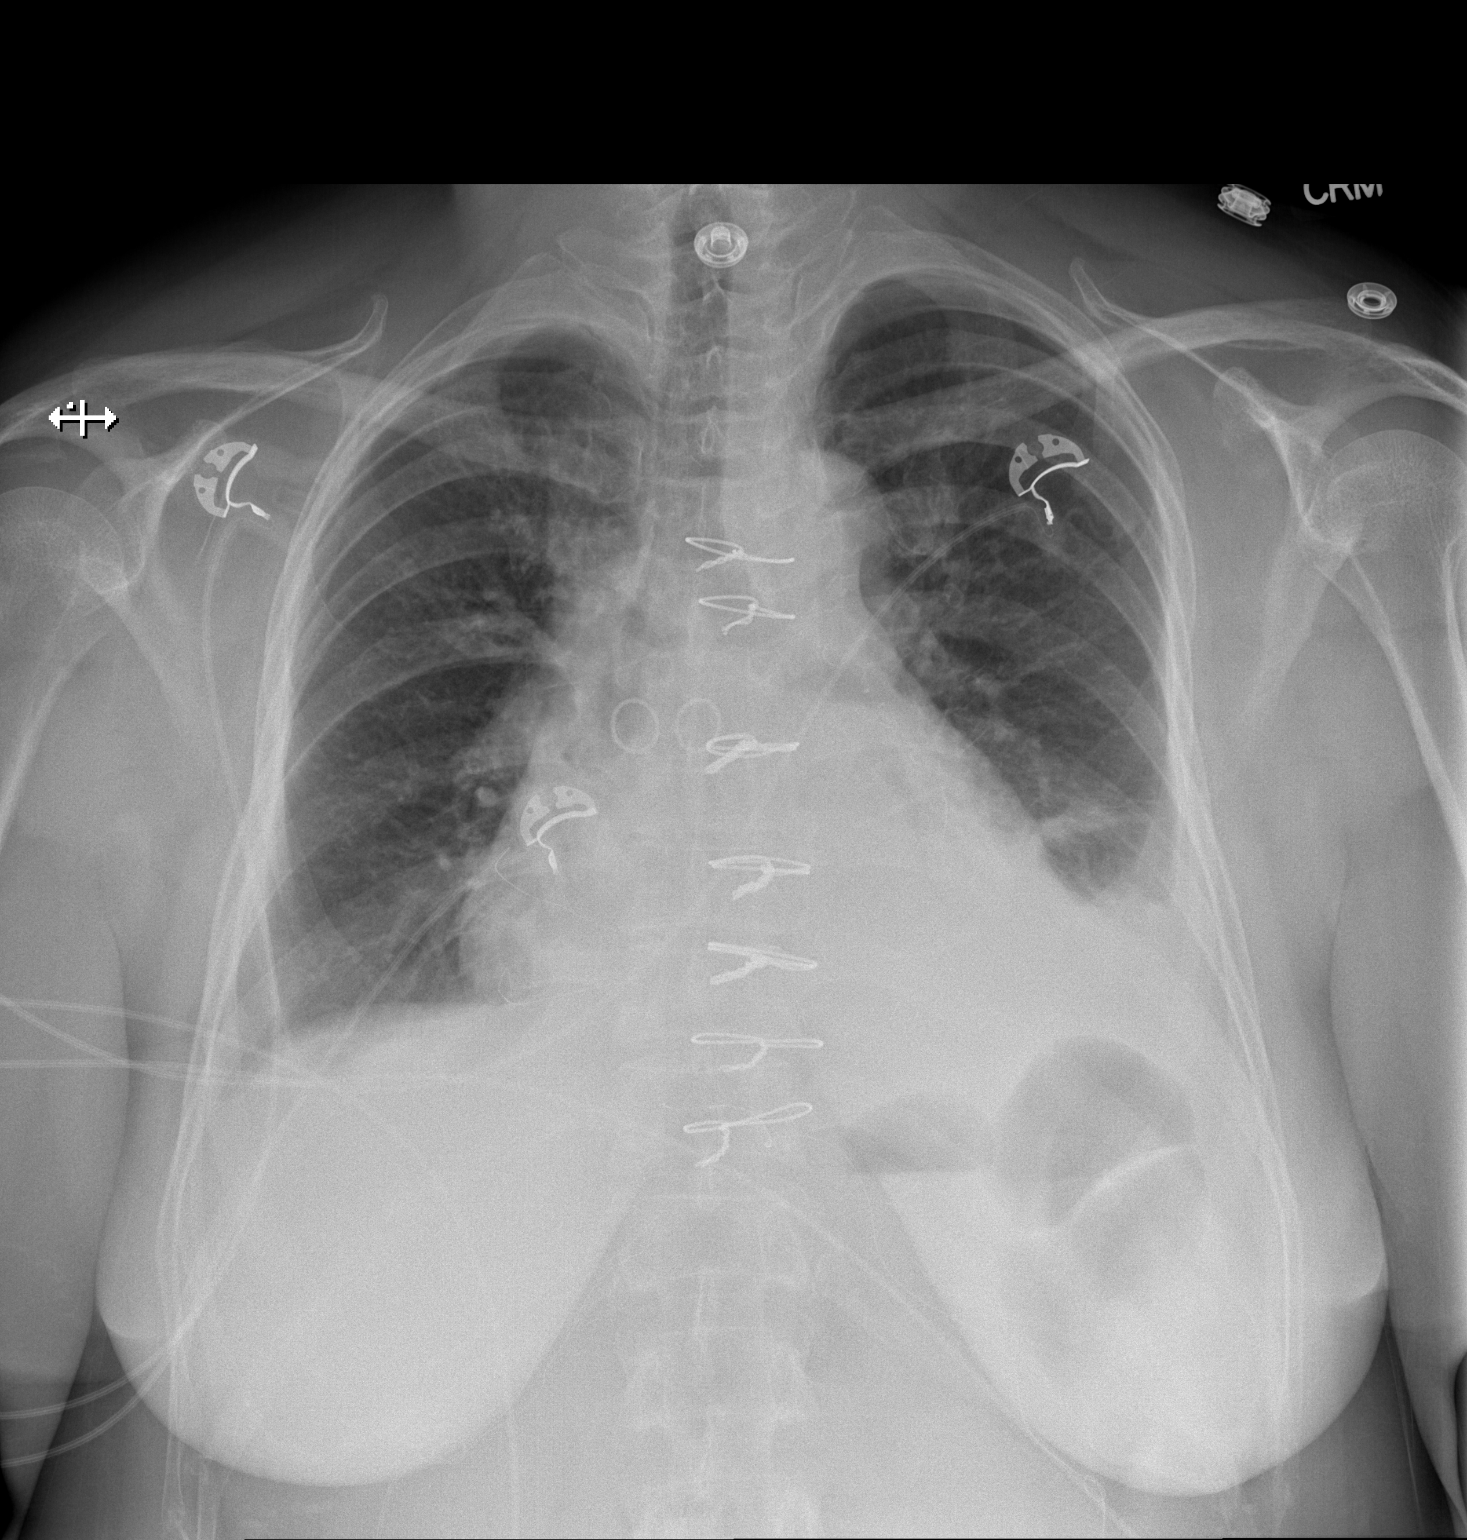

[w chest lat]
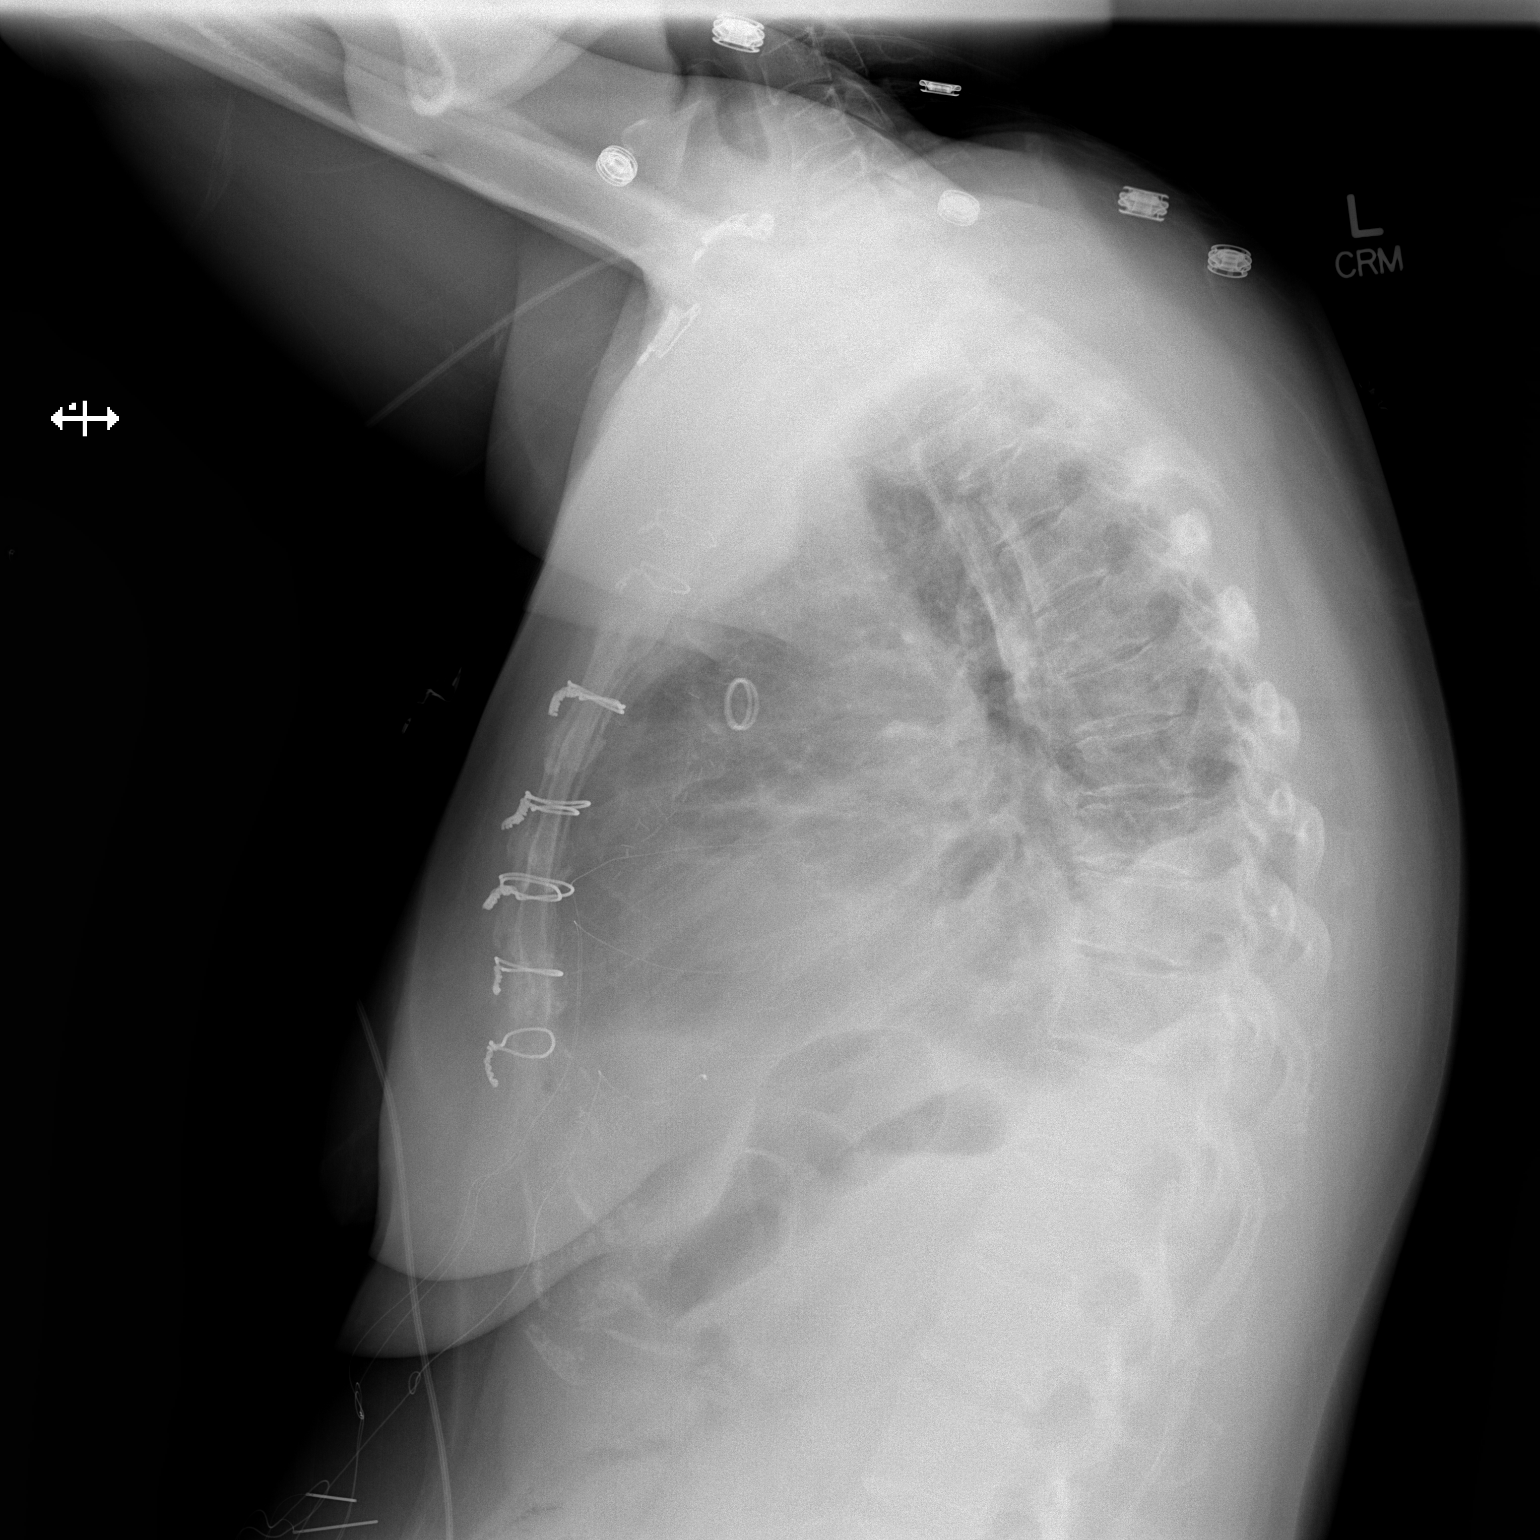

[2 of 2 positions shown; findings below may reference images not displayed]

FINDINGS: The right lung is well-expanded. There has been interval resolution
of right basilar atelectasis. Only a tiny amount of pleural fluid
persists on the right. On the left there has been some improvement
and basilar atelectasis or pneumonia. A small pleural effusion
persists. The cardiac silhouette is mildly enlarged. The pulmonary
vascularity is mildly engorged. The sternal wires are intact. The
retrosternal soft tissues are normal. There is mild loss of height
of T6 through T8 which is slightly more conspicuous today.
IMPRESSION: Interval clearing of right basilar atelectasis. Persistent left
basilar atelectasis or pneumonia. Small left pleural effusion and
trace right pleural effusion remain. Persistent mild cardiomegaly
with mild central pulmonary vascular congestion.

## 2019-12-14 ENCOUNTER — Other Ambulatory Visit: Payer: Self-pay | Admitting: Cardiology

## 2019-12-14 MED FILL — CARVEDILOL 6.25 MG TABLET: 6.25 | 30 days supply | Qty: 60 | Fill #1

## 2019-12-15 ENCOUNTER — Other Ambulatory Visit: Payer: Self-pay | Admitting: Cardiology

## 2019-12-15 ENCOUNTER — Other Ambulatory Visit: Payer: Self-pay | Admitting: Adult Health

## 2019-12-15 MED ORDER — CARVEDILOL 6.25 MG PO TABS: 6.2500 mg | ORAL_TABLET | Freq: Two times a day (BID) | ORAL | 0 refills | Status: DC

## 2019-12-15 MED FILL — ATORVASTATIN CALCIUM 40 MG: 40 | 30 days supply | Qty: 30 | Fill #0

## 2019-12-15 NOTE — Addendum Note (Signed)
Addended by: Raelyn Number on: 12/15/2019 04:30 PM   Modules accepted: Orders

## 2019-12-15 NOTE — Telephone Encounter (Signed)
*  STAT* If patient is at the pharmacy, call can be transferred to refill team.   1. Which medications need to be refilled? (please list name of each medication and dose if known) carvedilol (COREG) 6.25 MG tablet atorvastatin (LIPITOR) 40 MG tablet  2. Which pharmacy/location (including street and city if local pharmacy) is medication to be sent to? [facility] [place] [place] - [place] [address]  3. Do they need a 30 day or 90 day supply? 90 day supply  Patient states she is completely out of medication. However, an appointment has been scheduled for 01/05/20 at 2:45 PM with [name].

## 2020-01-05 ENCOUNTER — Ambulatory Visit: Payer: Self-pay | Admitting: Adult Health

## 2020-01-18 MED FILL — ATORVASTATIN CALCIUM 40 MG: 40 | 30 days supply | Qty: 30 | Fill #1

## 2020-01-22 MED FILL — CARVEDILOL 6.25 MG TABLET: 6.25 | 30 days supply | Qty: 60 | Fill #2

## 2020-02-15 ENCOUNTER — Other Ambulatory Visit: Payer: Self-pay | Admitting: Cardiology

## 2020-02-15 ENCOUNTER — Ambulatory Visit: Payer: Self-pay | Admitting: Family Medicine

## 2020-02-17 ENCOUNTER — Other Ambulatory Visit: Payer: Self-pay | Admitting: Cardiology

## 2020-02-17 NOTE — Telephone Encounter (Signed)
Pt was suppose to have a New Pt appt on Monday with Dr. Alvis Lemmings but she was told shed need to reschedule and it has been pushed back to October but pt is in need of refills for atorvastatin (LIPITOR) 40 MG tablet   And carvedilol (COREG) 6.25 MG tablet  Pt stated she requested this with the pharmacy last week and has no more medication/ Pt wanted to see if a courtesy refill can be sent until her appt or if her appt can move up back to this week/ please advise

## 2020-02-18 ENCOUNTER — Other Ambulatory Visit: Payer: Self-pay | Admitting: Pharmacist

## 2020-02-18 ENCOUNTER — Telehealth: Payer: Self-pay | Admitting: Family Medicine

## 2020-02-18 IMAGING — CR DG CHEST 2V
2 series · 2 of 2 positions shown · non-contrast
Comparison: 04/29/2018

CLINICAL DATA: History of coronary bypass grafting

EXAM:
CHEST - 2 VIEW

[w chest pa]
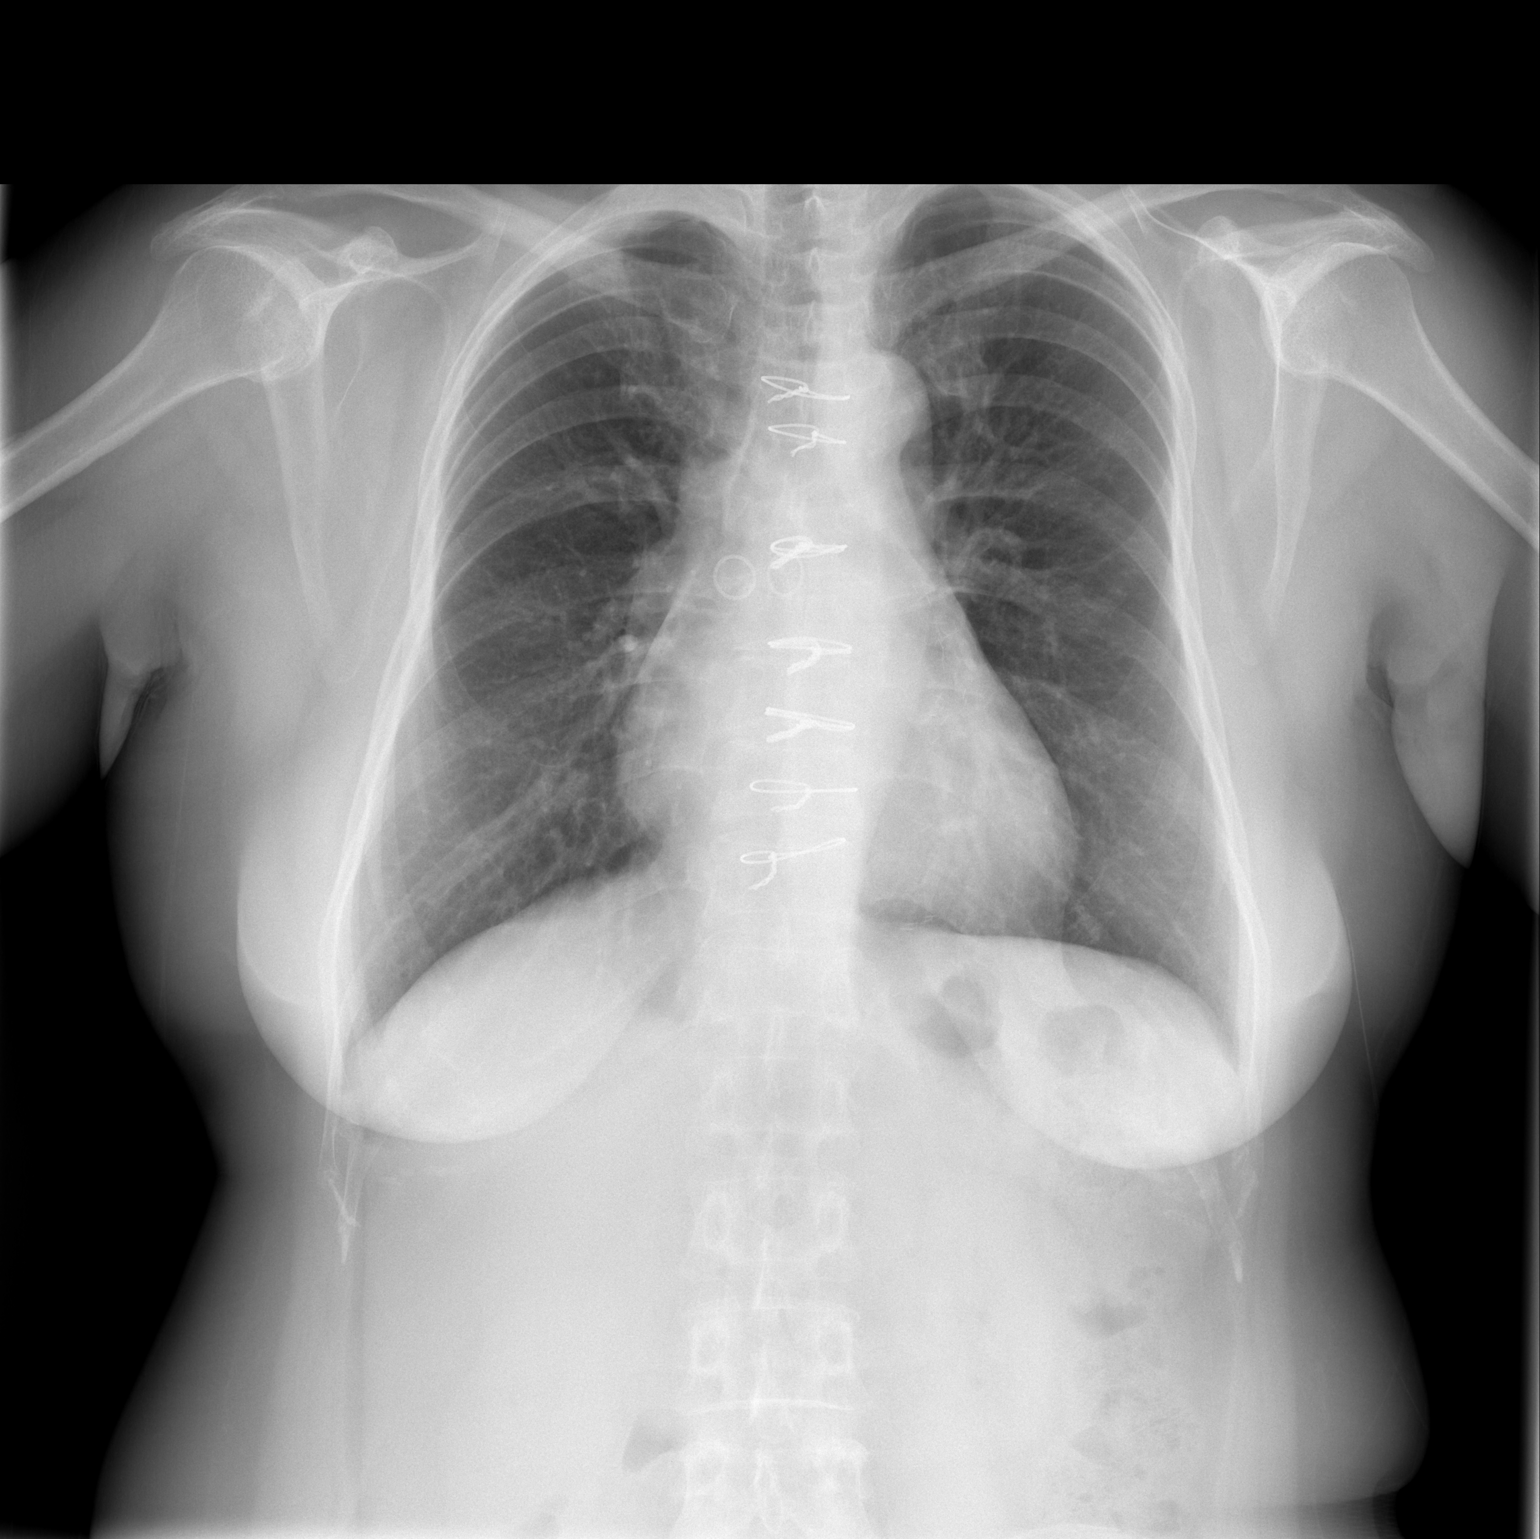

[w chest lat]
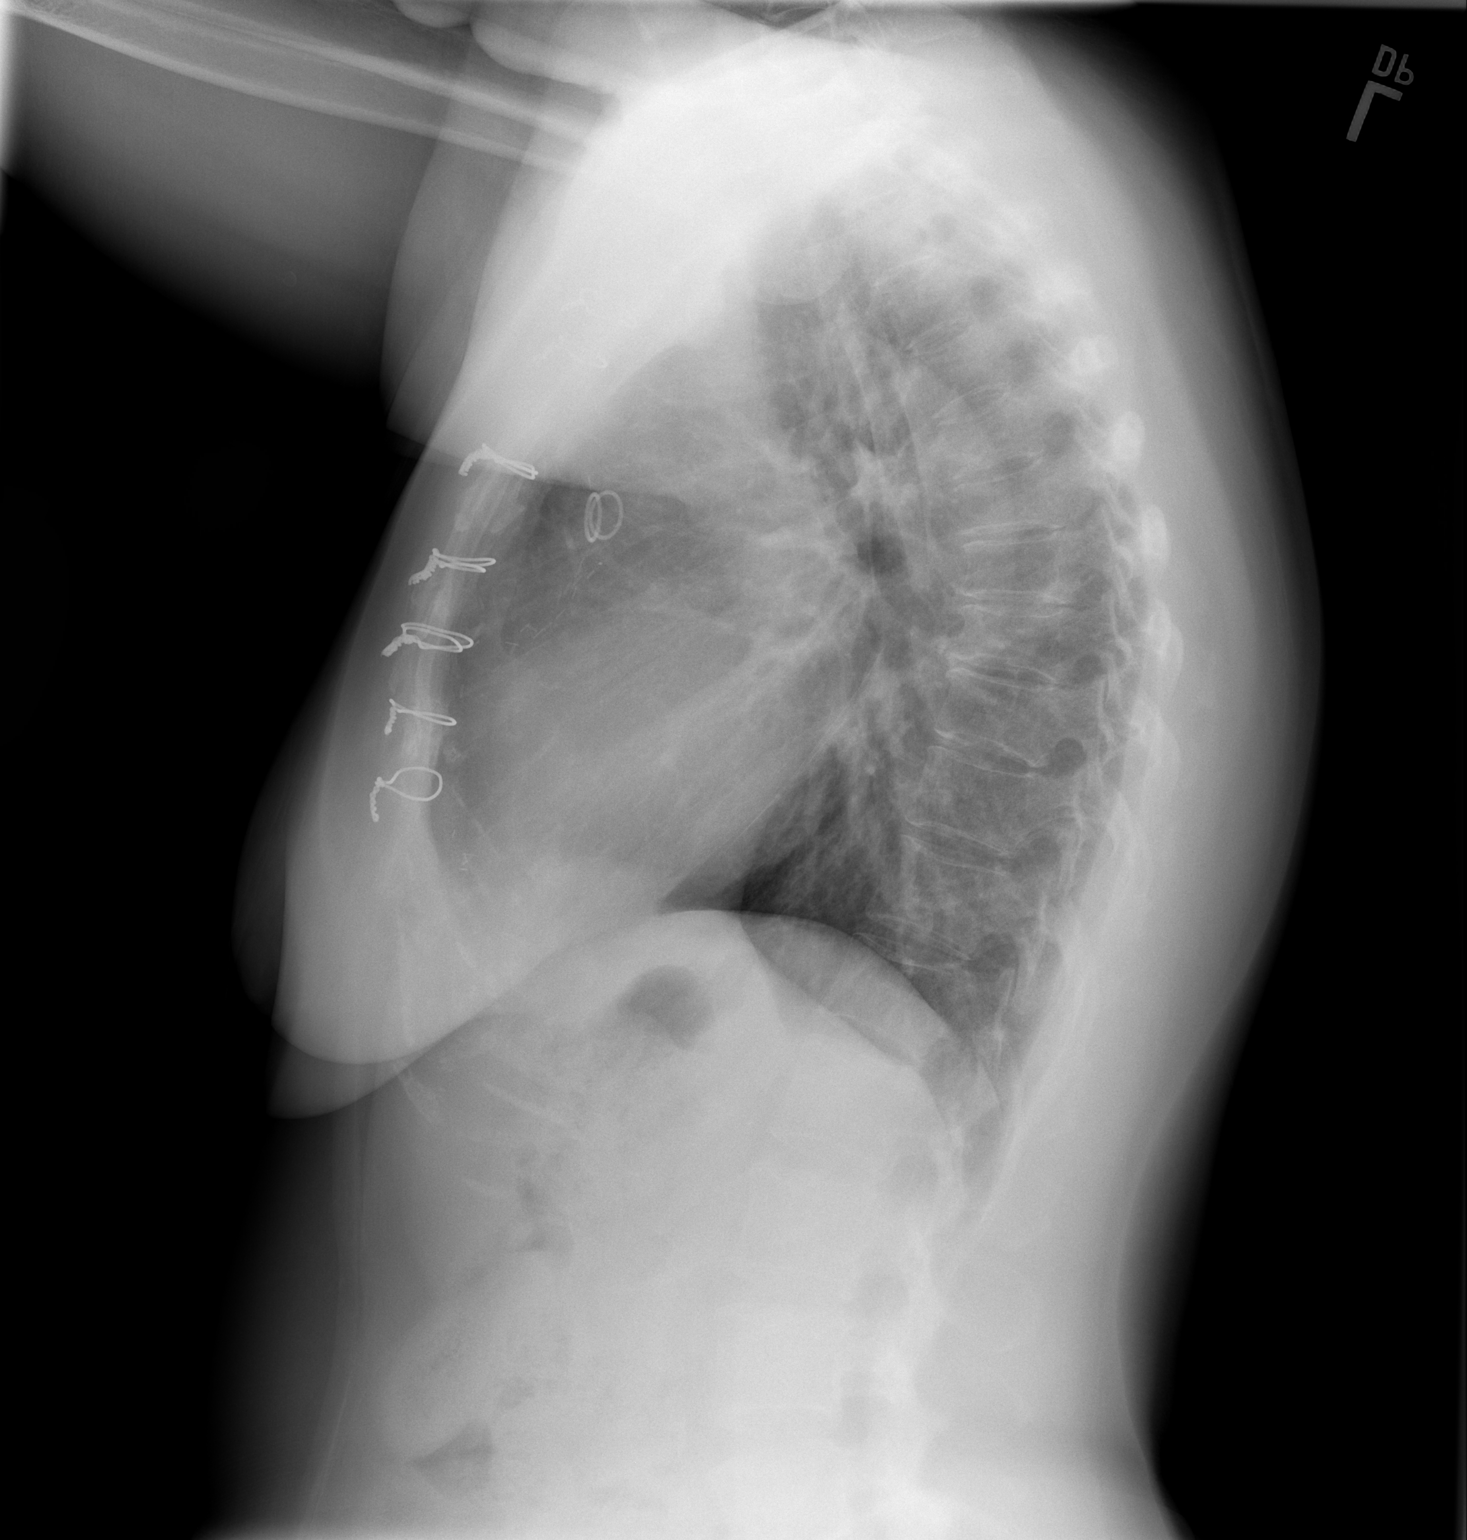

[2 of 2 positions shown; findings below may reference images not displayed]

FINDINGS: Cardiac shadow is stable. Postsurgical changes are again seen.
Previously seen left effusion and underlying atelectasis have
resolved in the interval. No acute bony abnormality is seen. Stable
compression deformities are noted in the midthoracic spine.
IMPRESSION: Interval clearing left basilar changes.  No acute abnormality noted.

## 2020-02-18 MED ORDER — CARVEDILOL 6.25 MG PO TABS: 6.2500 mg | ORAL_TABLET | Freq: Two times a day (BID) | ORAL | 1 refills | Status: DC

## 2020-02-18 MED ORDER — ATORVASTATIN CALCIUM 40 MG PO TABS: ORAL_TABLET | ORAL | 1 refills | Status: DC

## 2020-02-18 MED FILL — ?CARVEDILOL 6.25 MG TABLET: 6.25 | 30 days supply | Qty: 60 | Fill #0

## 2020-02-18 MED FILL — ?ATORVASTATIN 40MG TABLET: 40 | 30 days supply | Qty: 30 | Fill #0

## 2020-02-18 NOTE — Telephone Encounter (Signed)
Sent an encounter to clinically pharmacist via teams

## 2020-02-18 NOTE — Telephone Encounter (Signed)
Patient called in and requested for listed medications to be refilled since her appointment was rescheduled. Please send to [facility]  carvedilol (COREG) 6.25 MG tablet [[id number]]  atorvastatin (LIPITOR) 40 MG tablet [[id number]]

## 2020-02-18 NOTE — Telephone Encounter (Signed)
Patient was called, verified and informed of the medication refills. Patient verbalized understanding and had no further questions at this time.

## 2020-02-18 NOTE — Telephone Encounter (Signed)
Rx sent 

## 2020-03-18 MED FILL — ?ATORVASTATIN 40MG TABLET: 40 | 30 days supply | Qty: 30 | Fill #1

## 2020-03-24 MED FILL — ?CARVEDILOL 6.25 MG TABLET: 6.25 | 30 days supply | Qty: 60 | Fill #1

## 2020-04-18 ENCOUNTER — Ambulatory Visit: Payer: Self-pay | Admitting: Family Medicine

## 2020-04-29 ENCOUNTER — Other Ambulatory Visit: Payer: Self-pay | Admitting: Family Medicine

## 2020-04-29 NOTE — Telephone Encounter (Signed)
Requested medication (s) are due for refill today: yes  Requested medication (s) are on the active medication list: yes  Last refill:  02/18/20 #30 1 refill  Future visit scheduled: no  Notes to clinic:  no valid encounter in 12 months, last labs 05/07/2018. Called patient to schedule appt, no answer, left voicemail to call clinic. Do you want to refill?      Requested Prescriptions  Pending Prescriptions Disp Refills   atorvastatin (LIPITOR) 40 MG tablet [Pharmacy Med Name: ATORVASTATIN 40MG  TABLET 40 Tablet] 30 tablet 1    Sig: TAKE 1 TABLET BY MOUTH DAILY AT 6PM.      Cardiovascular:  Antilipid - Statins Failed - 04/29/2020 10:53 AM      Failed - Total Cholesterol in normal range and within 360 days    Cholesterol, Total  Date Value Ref Range Status  05/07/2018 123 100 - 199 mg/dL Final          Failed - LDL in normal range and within 360 days    LDL Calculated  Date Value Ref Range Status  05/07/2018 74 0 - 99 mg/dL Final          Failed - HDL in normal range and within 360 days    HDL  Date Value Ref Range Status  05/07/2018 36 (L) >39 mg/dL Final          Failed - Triglycerides in normal range and within 360 days    Triglycerides  Date Value Ref Range Status  05/07/2018 63 0 - 149 mg/dL Final          Failed - Valid encounter within last 12 months    Recent Outpatient Visits   None            Passed - Patient is not pregnant

## 2020-05-02 ENCOUNTER — Other Ambulatory Visit: Payer: Self-pay | Admitting: Pharmacist

## 2020-05-02 NOTE — Telephone Encounter (Signed)
Pt requesting atorvastatin refills to be sent to [facility] pharmacy. She is a patient under the care Raliegh Ip. She has not been seen in some time. Instructed patient that she must schedule an appointment. Will send request to Dorene Grebe to see if pt can get a fill to hold until that appointment.

## 2020-05-04 ENCOUNTER — Other Ambulatory Visit: Payer: Self-pay | Admitting: Family Medicine

## 2020-05-04 ENCOUNTER — Encounter: Payer: Self-pay | Admitting: Interventional Cardiology

## 2020-05-04 MED FILL — ?CARVEDILOL 6.25 MG TABLET: 6.25 | 30 days supply | Qty: 60 | Fill #2

## 2020-05-04 NOTE — Telephone Encounter (Signed)
error 

## 2020-05-13 ENCOUNTER — Telehealth: Payer: Self-pay | Admitting: Family Medicine

## 2020-05-13 ENCOUNTER — Other Ambulatory Visit: Payer: Self-pay | Admitting: Family Medicine

## 2020-05-13 DIAGNOSIS — I214 Non-ST elevation (NSTEMI) myocardial infarction: Secondary | ICD-10-CM

## 2020-05-13 MED ORDER — ATORVASTATIN CALCIUM 40 MG PO TABS: ORAL_TABLET | ORAL | 0 refills | Status: DC

## 2020-05-13 MED ORDER — CARVEDILOL 6.25 MG PO TABS: 6.2500 mg | ORAL_TABLET | Freq: Two times a day (BID) | ORAL | 0 refills | Status: DC

## 2020-05-13 NOTE — Telephone Encounter (Signed)
Multiple attempts to contact patient concerning much needed follow up appointment. We sent 30 day refills on medications today.

## 2020-05-16 ENCOUNTER — Other Ambulatory Visit: Payer: Self-pay | Admitting: Family Medicine

## 2020-05-16 MED FILL — ?ATORVASTATIN 40MG TABLET: 40 | 30 days supply | Qty: 30 | Fill #0

## 2020-05-16 NOTE — Telephone Encounter (Signed)
DOne

## 2020-07-21 ENCOUNTER — Other Ambulatory Visit: Payer: Self-pay | Admitting: Family Medicine

## 2020-07-21 ENCOUNTER — Encounter: Payer: Self-pay | Admitting: Family Medicine

## 2020-07-21 ENCOUNTER — Ambulatory Visit: Payer: Self-pay | Attending: Family Medicine | Admitting: Family Medicine

## 2020-07-21 ENCOUNTER — Other Ambulatory Visit: Payer: Self-pay

## 2020-07-21 VITALS — BP 164/81 | HR 70 | Ht 60.0 in | Wt 159.0 lb

## 2020-07-21 DIAGNOSIS — I214 Non-ST elevation (NSTEMI) myocardial infarction: Secondary | ICD-10-CM

## 2020-07-21 DIAGNOSIS — Z951 Presence of aortocoronary bypass graft: Secondary | ICD-10-CM

## 2020-07-21 DIAGNOSIS — I10 Essential (primary) hypertension: Secondary | ICD-10-CM

## 2020-07-21 DIAGNOSIS — N6320 Unspecified lump in the left breast, unspecified quadrant: Secondary | ICD-10-CM

## 2020-07-21 MED ORDER — ATORVASTATIN CALCIUM 40 MG PO TABS: ORAL_TABLET | ORAL | 6 refills | Status: DC

## 2020-07-21 MED ORDER — CARVEDILOL 6.25 MG PO TABS: 6.2500 mg | ORAL_TABLET | Freq: Two times a day (BID) | ORAL | 1 refills | Status: DC

## 2020-07-21 MED FILL — ?ATORVASTATIN 40MG TABLET: 40 | 30 days supply | Qty: 30 | Fill #0

## 2020-07-21 MED FILL — ?CARVEDILOL 6.25 MG TABLET: 6.25 | 30 days supply | Qty: 60 | Fill #0

## 2020-07-21 NOTE — Progress Notes (Signed)
Needs refills on medications. States that she feels a lump in her breast.

## 2020-07-21 NOTE — Patient Instructions (Signed)

## 2020-07-21 NOTE — Progress Notes (Signed)
Subjective:  Patient ID: [name], female    DOB: 06-01-1966  Age: 55 y.o. MRN: [id number]  CC: New Patient (Initial Visit)   HPI [name] 55 year old female with a history of CAD status post CABG in 04/2018. Previously followed by the Patient [place] but she would like to switch here as her Fiance comes to this Clinic Last seen by cardiac surgery in 04/2019; last seen by previous PCP in 04/2018 Her cardiologist is Curt Bears she states but I do not see recent Cardiology visit.   She took her Coreg this morning but her BP is elevated.  She attributes this to inadequate sleep last night.  Her blood pressure at her last office visit was normal.  Denies presence of chest pain or dyspnea.  Complains of a L breast mass increasing in size and it hurts when she wears a Bra and has been present for a month. She has never had a mammogram.  Denies family history of breast cancer.  Endorses use of oral contraceptive pills in the past. Past Medical History:  Diagnosis Date  . Anemia   . Hypertension   . Hypocalcemia   . NSTEMI (non-ST elevated myocardial infarction) [facility])     Past Surgical History:  Procedure Laterality Date  . CESAREAN SECTION    . CORONARY ARTERY BYPASS GRAFT N/A 04/24/2018   Procedure: CORONARY ARTERY BYPASS GRAFTING (CABG) x3  using left internal mammary artery and right greater saphenous vein harvested endoscopically.;  Surgeon: Ivin Poot, MD;  Location: [place];  Service: Open Heart Surgery;  Laterality: N/A;  . LEFT HEART CATH AND CORONARY ANGIOGRAPHY N/A 04/21/2018   Procedure: LEFT HEART CATH AND CORONARY ANGIOGRAPHY;  Surgeon: Leonie Man, MD;  Location: [place] [facility];  Service: Cardiovascular;  Laterality: N/A;  . TEE WITHOUT CARDIOVERSION N/A 04/24/2018   Procedure: TRANSESOPHAGEAL ECHOCARDIOGRAM (TEE);  Surgeon: Prescott Gum, Collier Salina, MD;  Location: [place];  Service: Open Heart Surgery;  Laterality: N/A;    Family History  Problem Relation Age of  Onset  . Hypertension Father     Allergies  Allergen Reactions  . Pork-Derived Products     Outpatient Medications Prior to Visit  Medication Sig Dispense Refill  . acetaminophen (TYLENOL) 325 MG tablet Take 2 tablets (650 mg total) by mouth every 6 (six) hours as needed for mild pain.    Marland Kitchen atorvastatin (LIPITOR) 40 MG tablet TAKE 1 TABLET BY MOUTH DAILY AT 6PM. 30 tablet 0  . carvedilol (COREG) 6.25 MG tablet Take 1 tablet (6.25 mg total) by mouth 2 (two) times daily with a meal. 60 tablet 0   No facility-administered medications prior to visit.     ROS Review of Systems  Constitutional: Negative for activity change, appetite change and fatigue.  HENT: Negative for congestion, sinus pressure and sore throat.   Eyes: Negative for visual disturbance.  Respiratory: Negative for cough, chest tightness, shortness of breath and wheezing.   Cardiovascular: Negative for chest pain and palpitations.  Gastrointestinal: Negative for abdominal distention, abdominal pain and constipation.  Endocrine: Negative for polydipsia.  Genitourinary: Negative for dysuria and frequency.  Musculoskeletal: Negative for arthralgias and back pain.  Skin: Negative for rash.  Neurological: Negative for tremors, light-headedness and numbness.  Hematological: Does not bruise/bleed easily.  Psychiatric/Behavioral: Negative for agitation and behavioral problems.    Objective:  BP (!) 164/81   Pulse 70   Ht 5' (1.524 m)   Wt 159 lb (72.1 kg)   SpO2 99%  BMI 31.05 kg/m   BP/Weight 07/21/2020 07/09/2018 35/36/1443  Systolic BP 154 008 676  Diastolic BP 81 79 68  Wt. (Lbs) 159 149.6 154.6  BMI 31.05 36.07 37.28      Physical Exam Constitutional:      Appearance: She is well-developed.  Neck:     Vascular: No JVD.  Cardiovascular:     Rate and Rhythm: Normal rate.     Heart sounds: Normal heart sounds. No murmur heard.     Comments: Vertical sternotomy scar which is healed Pulmonary:      Effort: Pulmonary effort is normal.     Breath sounds: Normal breath sounds. No wheezing or rales.  Chest:     Chest wall: No tenderness.  Breasts:     Right: No mass, skin change, axillary adenopathy or supraclavicular adenopathy.     Left: Mass (2 o'clock, hard, no TTP, minimally mobile) present. No skin change, axillary adenopathy or supraclavicular adenopathy.    Abdominal:     General: Bowel sounds are normal. There is no distension.     Palpations: Abdomen is soft. There is no mass.     Tenderness: There is no abdominal tenderness.  Musculoskeletal:        General: Normal range of motion.     Right lower leg: No edema.     Left lower leg: No edema.  Lymphadenopathy:     Upper Body:     Right upper body: No supraclavicular or axillary adenopathy.     Left upper body: No supraclavicular or axillary adenopathy.  Neurological:     Mental Status: She is alert and oriented to person, place, and time.  Psychiatric:        Mood and Affect: Mood normal.     CMP Latest Ref Rng & Units 05/07/2018 04/29/2018 04/28/2018  Glucose 65 - 99 mg/dL 89 79 95  BUN 6 - 24 mg/dL $Remove'16 14 12  'HOlCfye$ Creatinine 0.57 - 1.00 mg/dL 0.79 0.77 0.67  Sodium 134 - 144 mmol/L 137 134(L) 135  Potassium 3.5 - 5.2 mmol/L 4.9 4.6 4.4  Chloride 96 - 106 mmol/L 100 102 108  CO2 20 - 29 mmol/L 22 25 20(L)  Calcium 8.7 - 10.2 mg/dL 9.1 8.7(L) 8.4(L)  Total Protein 6.0 - 8.5 g/dL 8.1 - -  Total Bilirubin 0.0 - 1.2 mg/dL 0.4 - -  Alkaline Phos 39 - 117 IU/L 85 - -  AST 0 - 40 IU/L 22 - -  ALT 0 - 32 IU/L 14 - -    Lipid Panel     Component Value Date/Time   CHOL 123 05/07/2018 1005   TRIG 63 05/07/2018 1005   HDL 36 (L) 05/07/2018 1005   CHOLHDL 3.4 05/07/2018 1005   CHOLHDL 3.8 04/19/2018 0406   VLDL 11 04/19/2018 0406   LDLCALC 74 05/07/2018 1005    CBC    Component Value Date/Time   WBC 6.0 05/07/2018 1005   WBC 4.9 04/29/2018 0318   RBC 4.14 05/07/2018 1005   RBC 3.63 (L) 04/29/2018 0318   HGB 10.6  (L) 05/07/2018 1005   HCT 34.9 05/07/2018 1005   PLT 538 (H) 05/07/2018 1005   MCV 84 05/07/2018 1005   MCH 25.6 (L) 05/07/2018 1005   MCH 26.2 04/29/2018 0318   MCHC 30.4 (L) 05/07/2018 1005   MCHC 29.5 (L) 04/29/2018 0318   RDW 18.5 (H) 05/07/2018 1005   LYMPHSABS 1.4 05/07/2018 1005   EOSABS 0.1 05/07/2018 1005   BASOSABS 0.0 05/07/2018  1005    Lab Results  Component Value Date   HGBA1C 5.9 (H) 04/18/2018    Assessment & Plan:  1. Left breast mass - MM DIAG BREAST TOMO BILATERAL; Future - US BREAST LTD UNI LEFT INC AXILLA; Future  2. NSTEMI (non-ST elevated myocardial infarction) (HCC) Asymptomatic Risk factor modification - CMP14+EGFR - atorvastatin (LIPITOR) 40 MG tablet; TAKE 1 TABLET BY MOUTH DAILY AT 6PM.  Dispense: 30 tablet; Refill: 6  3. S/P CABG x 3 Stable  4. Essential hypertension Uncontrolled We will see back at next visit and adjust regimen if blood pressure is still elevated Counseled on blood pressure goal of less than 130/80, low-sodium, DASH diet, medication compliance, 150 minutes of moderate intensity exercise per week. Discussed medication compliance, adverse effects. - carvedilol (COREG) 6.25 MG tablet; Take 1 tablet (6.25 mg total) by mouth 2 (two) times daily with a meal.  Dispense: 60 tablet; Refill: 1    Meds ordered this encounter  Medications  . atorvastatin (LIPITOR) 40 MG tablet    Sig: TAKE 1 TABLET BY MOUTH DAILY AT 6PM.    Dispense:  30 tablet    Refill:  6  . carvedilol (COREG) 6.25 MG tablet    Sig: Take 1 tablet (6.25 mg total) by mouth 2 (two) times daily with a meal.    Dispense:  60 tablet    Refill:  1    Follow-up: Return in about 6 weeks (around 09/01/2020) for Follow-up of hypertension and left breast mass.       Charlott Rakes, MD, FAAFP. [facility], [place]   07/21/2020, 9:37 AM

## 2020-07-22 ENCOUNTER — Telehealth: Payer: Self-pay

## 2020-07-22 LAB — CMP14+EGFR
ALT: 15 IU/L (ref 0–32)
AST: 22 IU/L (ref 0–40)
Albumin/Globulin Ratio: 0.9 — ABNORMAL LOW (ref 1.2–2.2)
Albumin: 3.7 g/dL — ABNORMAL LOW (ref 3.8–4.9)
Alkaline Phosphatase: 162 IU/L — ABNORMAL HIGH (ref 44–121)
BUN/Creatinine Ratio: 17 (ref 9–23)
BUN: 15 mg/dL (ref 6–24)
Bilirubin Total: 0.2 mg/dL (ref 0.0–1.2)
CO2: 22 mmol/L (ref 20–29)
Calcium: 9.2 mg/dL (ref 8.7–10.2)
Chloride: 100 mmol/L (ref 96–106)
Creatinine, Ser: 0.87 mg/dL (ref 0.57–1.00)
GFR calc Af Amer: 89 mL/min/{1.73_m2} (ref >59)
GFR calc non Af Amer: 77 mL/min/{1.73_m2} (ref >59)
Globulin, Total: 4.3 g/dL (ref 1.5–4.5)
Glucose: 103 mg/dL — ABNORMAL HIGH (ref 65–99)
Potassium: 4.1 mmol/L (ref 3.5–5.2)
Sodium: 138 mmol/L (ref 134–144)
Total Protein: 8 g/dL (ref 6.0–8.5)

## 2020-07-22 NOTE — Telephone Encounter (Signed)
-----   Message from Hoy Register, MD sent at 07/22/2020  9:25 AM EST ----- Labs reveal one of her liver enzymes is elevated. I will repeat at her next visit. Other labs are stable

## 2020-07-22 NOTE — Telephone Encounter (Signed)
Pt was called and informed of lab results via VM. 

## 2020-08-01 MED FILL — ?ATORVASTATIN 40MG TABLET: 40 | 30 days supply | Qty: 30 | Fill #0

## 2020-08-01 MED FILL — ?CARVEDILOL 6.25 MG TABLET: 6.25 | 30 days supply | Qty: 60 | Fill #0

## 2020-08-30 MED FILL — ?ATORVASTATIN 40MG TABLET: 40 | 30 days supply | Qty: 30 | Fill #1

## 2020-08-30 MED FILL — ?CARVEDILOL 6.25 MG TABLET: 6.25 | 30 days supply | Qty: 60 | Fill #1

## 2020-08-31 ENCOUNTER — Ambulatory Visit: Payer: Self-pay

## 2020-09-24 ENCOUNTER — Other Ambulatory Visit: Payer: Self-pay

## 2020-10-04 MED FILL — Atorvastatin Calcium Tab 40 MG (Base Equivalent): ORAL | 30 days supply | Qty: 30 | Fill #0 | Status: AC

## 2020-10-05 ENCOUNTER — Other Ambulatory Visit: Payer: Self-pay

## 2020-10-06 ENCOUNTER — Other Ambulatory Visit: Payer: Self-pay

## 2020-10-19 ENCOUNTER — Telehealth: Payer: Self-pay | Admitting: Family Medicine

## 2020-10-19 NOTE — Telephone Encounter (Signed)
Copied from CRM [id number]. Topic: Appointment Scheduling - Scheduling Inquiry for Clinic >> Oct 10, 2020  3:06 PM Daphine Deutscher D wrote: Reason for CRM: Pt wants to schd an appt for applying for the orange card.  CB#  [phone]  Called patient and LVM x2 advising her to call [phone] to schedule an appointment with financial.

## 2020-10-24 ENCOUNTER — Other Ambulatory Visit: Payer: Self-pay

## 2020-10-24 ENCOUNTER — Other Ambulatory Visit: Payer: Self-pay | Admitting: Family Medicine

## 2020-10-24 DIAGNOSIS — I10 Essential (primary) hypertension: Secondary | ICD-10-CM

## 2020-10-24 MED ORDER — CARVEDILOL 6.25 MG PO TABS
ORAL_TABLET | Freq: Two times a day (BID) | ORAL | 0 refills | Status: DC
  Filled 2020-10-24: qty 60, 30d supply, fill #0

## 2020-10-24 NOTE — Telephone Encounter (Signed)
Requested medication (s) are due for refill today - yes  Requested medication (s) are on the active medication list -yes  Future visit scheduled -no  Last refill: 08/30/20  Notes to clinic: Patient is overdue appointment for BP follow up- sent for review of request- she was supposed to contact [name] for insurance help  Requested Prescriptions  Pending Prescriptions Disp Refills   carvedilol (COREG) 6.25 MG tablet 60 tablet 1    Sig: TAKE 1 TABLET (6.25 MG TOTAL) BY MOUTH 2 (TWO) TIMES DAILY WITH A MEAL.      Cardiovascular:  Beta Blockers Failed - 10/24/2020  2:32 PM      Failed - Last BP in normal range    BP Readings from Last 1 Encounters:  07/21/20 (!) 164/81          Passed - Last Heart Rate in normal range    Pulse Readings from Last 1 Encounters:  07/21/20 70          Passed - Valid encounter within last 6 months    Recent Outpatient Visits           3 months ago Left breast mass   [facility] [place], Odette Horns, MD                    Requested Prescriptions  Pending Prescriptions Disp Refills   carvedilol (COREG) 6.25 MG tablet 60 tablet 1    Sig: TAKE 1 TABLET (6.25 MG TOTAL) BY MOUTH 2 (TWO) TIMES DAILY WITH A MEAL.      Cardiovascular:  Beta Blockers Failed - 10/24/2020  2:32 PM      Failed - Last BP in normal range    BP Readings from Last 1 Encounters:  07/21/20 (!) 164/81          Passed - Last Heart Rate in normal range    Pulse Readings from Last 1 Encounters:  07/21/20 70          Passed - Valid encounter within last 6 months    Recent Outpatient Visits           3 months ago Left breast mass   [facility] Hoy Register, MD

## 2020-10-27 ENCOUNTER — Other Ambulatory Visit: Payer: Self-pay

## 2020-11-03 ENCOUNTER — Other Ambulatory Visit: Payer: Self-pay

## 2020-11-03 MED FILL — Atorvastatin Calcium Tab 40 MG (Base Equivalent): ORAL | 30 days supply | Qty: 30 | Fill #1 | Status: CN

## 2020-11-10 ENCOUNTER — Other Ambulatory Visit: Payer: Self-pay

## 2020-11-11 ENCOUNTER — Other Ambulatory Visit: Payer: Self-pay

## 2020-11-11 MED FILL — Atorvastatin Calcium Tab 40 MG (Base Equivalent): ORAL | 30 days supply | Qty: 30 | Fill #1 | Status: AC

## 2020-12-22 ENCOUNTER — Other Ambulatory Visit: Payer: Self-pay

## 2020-12-22 ENCOUNTER — Other Ambulatory Visit: Payer: Self-pay | Admitting: Family Medicine

## 2020-12-22 DIAGNOSIS — I10 Essential (primary) hypertension: Secondary | ICD-10-CM

## 2020-12-22 MED FILL — Atorvastatin Calcium Tab 40 MG (Base Equivalent): ORAL | 30 days supply | Qty: 30 | Fill #2 | Status: CN

## 2020-12-22 NOTE — Telephone Encounter (Signed)
   Notes to clinic:  review for refill Looks like patient was in process of trying to schedule for orange card    Requested Prescriptions  Pending Prescriptions Disp Refills   carvedilol (COREG) 6.25 MG tablet 60 tablet 0    Sig: Take by mouth 2 (two) times daily with a meal.      Cardiovascular:  Beta Blockers Failed - 12/22/2020 12:13 AM      Failed - Last BP in normal range    BP Readings from Last 1 Encounters:  07/21/20 (!) 164/81          Passed - Last Heart Rate in normal range    Pulse Readings from Last 1 Encounters:  07/21/20 70          Passed - Valid encounter within last 6 months    Recent Outpatient Visits           5 months ago Left breast mass   [facility] Hoy Register, MD

## 2020-12-28 ENCOUNTER — Other Ambulatory Visit: Payer: Self-pay

## 2021-01-09 ENCOUNTER — Other Ambulatory Visit: Payer: Self-pay

## 2021-01-11 ENCOUNTER — Ambulatory Visit: Payer: Self-pay | Attending: Physician Assistant | Admitting: Physician Assistant

## 2021-01-11 ENCOUNTER — Other Ambulatory Visit: Payer: Self-pay

## 2021-01-11 DIAGNOSIS — R739 Hyperglycemia, unspecified: Secondary | ICD-10-CM

## 2021-01-11 DIAGNOSIS — I10 Essential (primary) hypertension: Secondary | ICD-10-CM

## 2021-01-11 DIAGNOSIS — Z13220 Encounter for screening for lipoid disorders: Secondary | ICD-10-CM

## 2021-01-11 MED ORDER — CARVEDILOL 6.25 MG PO TABS
ORAL_TABLET | Freq: Two times a day (BID) | ORAL | 3 refills | Status: DC
  Filled 2021-01-11: qty 60, 30d supply, fill #0
  Filled 2021-02-12: qty 60, 30d supply, fill #1

## 2021-01-11 MED FILL — Atorvastatin Calcium Tab 40 MG (Base Equivalent): ORAL | 30 days supply | Qty: 30 | Fill #2 | Status: AC

## 2021-01-11 NOTE — Progress Notes (Signed)
Virtual Visit via Telephone Note  I connected with [name] on 01/11/21 at  4:10 PM EDT by telephone and verified that I am speaking with the correct person using two identifiers.  Location: Patient: home Provider: [facility] office   I discussed the limitations, risks, security and privacy concerns of performing an evaluation and management service by telephone and the availability of in person appointments. I also discussed with the patient that there may be a patient responsible charge related to this service. The patient expressed understanding and agreed to proceed.   History of Present Illness:  patient needs carvedilol RF.  No labs since January.  No issues or concerns.  Checks BP OOO and gets around 120-130/80s.  No CP/HA/SOB    Observations/Objective:  NAd.  A&Ox3   Assessment and Plan: 1. Essential hypertension controlled - carvedilol (COREG) 6.25 MG tablet; TAKE 1 TABLET (6.25 MG TOTAL) BY MOUTH 2 (TWO) TIMES DAILY WITH A MEAL.  Dispense: 60 tablet; Refill: 3 - Comprehensive metabolic panel; Future - CBC with Differential/Platelet; Future  2. Screening cholesterol level Continue atorvastatin - Lipid panel; Future  3. Hyperglycemia I have had a lengthy discussion and provided education about insulin resistance and the intake of too much sugar/refined carbohydrates.  I have advised the patient to work at a goal of eliminating sugary drinks, candy, desserts, sweets, refined sugars, processed foods, and white carbohydrates.  The patient expresses understanding.  - Hemoglobin A1c; Future   Follow Up Instructions: See PCP in 4-6  months   I discussed the assessment and treatment plan with the patient. The patient was provided an opportunity to ask questions and all were answered. The patient agreed with the plan and demonstrated an understanding of the instructions.   The patient was advised to call back or seek an in-person evaluation if the symptoms worsen or if the  condition fails to improve as anticipated.  I provided 14 minutes of non-face-to-face time during this encounter.   Georgian Co, PA-C  Patient ID: [name], female   DOB: 1966-03-10, 55 y.o.   MRN: [id number]

## 2021-01-12 ENCOUNTER — Other Ambulatory Visit: Payer: Self-pay

## 2021-01-12 ENCOUNTER — Ambulatory Visit: Payer: Self-pay | Attending: Family Medicine

## 2021-01-12 DIAGNOSIS — I10 Essential (primary) hypertension: Secondary | ICD-10-CM

## 2021-01-12 DIAGNOSIS — Z13220 Encounter for screening for lipoid disorders: Secondary | ICD-10-CM

## 2021-01-12 DIAGNOSIS — R739 Hyperglycemia, unspecified: Secondary | ICD-10-CM

## 2021-01-13 LAB — CBC WITH DIFFERENTIAL/PLATELET
Basophils Absolute: 0 10*3/uL (ref 0.0–0.2)
Basos: 0 %
EOS (ABSOLUTE): 0.1 10*3/uL (ref 0.0–0.4)
Eos: 2 %
Hematocrit: 39.8 % (ref 34.0–46.6)
Hemoglobin: 13.5 g/dL (ref 11.1–15.9)
Immature Grans (Abs): 0 10*3/uL (ref 0.0–0.1)
Immature Granulocytes: 0 %
Lymphocytes Absolute: 2.4 10*3/uL (ref 0.7–3.1)
Lymphs: 40 %
MCH: 31.3 pg (ref 26.6–33.0)
MCHC: 33.9 g/dL (ref 31.5–35.7)
MCV: 92 fL (ref 79–97)
Monocytes Absolute: 0.4 10*3/uL (ref 0.1–0.9)
Monocytes: 6 %
Neutrophils Absolute: 3.1 10*3/uL (ref 1.4–7.0)
Neutrophils: 52 %
Platelets: 233 10*3/uL (ref 150–450)
RBC: 4.32 x10E6/uL (ref 3.77–5.28)
RDW: 12.6 % (ref 11.7–15.4)
WBC: 6.1 10*3/uL (ref 3.4–10.8)

## 2021-01-13 LAB — COMPREHENSIVE METABOLIC PANEL
ALT: 24 IU/L (ref 0–32)
AST: 30 IU/L (ref 0–40)
Albumin/Globulin Ratio: 0.9 — ABNORMAL LOW (ref 1.2–2.2)
Albumin: 3.9 g/dL (ref 3.8–4.9)
Alkaline Phosphatase: 158 IU/L — ABNORMAL HIGH (ref 44–121)
BUN/Creatinine Ratio: 20 (ref 9–23)
BUN: 16 mg/dL (ref 6–24)
Bilirubin Total: 0.2 mg/dL (ref 0.0–1.2)
CO2: 23 mmol/L (ref 20–29)
Calcium: 9.1 mg/dL (ref 8.7–10.2)
Chloride: 104 mmol/L (ref 96–106)
Creatinine, Ser: 0.8 mg/dL (ref 0.57–1.00)
Globulin, Total: 4.3 g/dL (ref 1.5–4.5)
Glucose: 101 mg/dL — ABNORMAL HIGH (ref 65–99)
Potassium: 3.8 mmol/L (ref 3.5–5.2)
Sodium: 142 mmol/L (ref 134–144)
Total Protein: 8.2 g/dL (ref 6.0–8.5)
eGFR: 88 mL/min/{1.73_m2} (ref >59)

## 2021-01-13 LAB — LIPID PANEL
Chol/HDL Ratio: 3.9 ratio (ref 0.0–4.4)
Cholesterol, Total: 218 mg/dL — ABNORMAL HIGH (ref 100–199)
HDL: 56 mg/dL (ref >39)
LDL Chol Calc (NIH): 136 mg/dL — ABNORMAL HIGH (ref 0–99)
Triglycerides: 145 mg/dL (ref 0–149)
VLDL Cholesterol Cal: 26 mg/dL (ref 5–40)

## 2021-01-13 LAB — HEMOGLOBIN A1C
Est. average glucose Bld gHb Est-mCnc: 120 mg/dL
Hgb A1c MFr Bld: 5.8 % — ABNORMAL HIGH (ref 4.8–5.6)

## 2021-01-17 ENCOUNTER — Telehealth: Payer: Self-pay | Admitting: Family Medicine

## 2021-01-17 NOTE — Telephone Encounter (Signed)
Called PT 4XS to resched this appt, cannot LVM to inform pt. Rings endlessly but no answer and no voicemail. Office will be closed that afternoon, pt needs to resched this appt if still necessary. Please note for the month of August, Dr. Alvis Lemmings will be working every Tuesday Virtually so if pt doesn't mind, schedule on a Tuesday.

## 2021-01-18 ENCOUNTER — Other Ambulatory Visit: Payer: Self-pay | Admitting: Physician Assistant

## 2021-01-25 ENCOUNTER — Encounter: Payer: Self-pay | Admitting: *Deleted

## 2021-02-12 MED FILL — Atorvastatin Calcium Tab 40 MG (Base Equivalent): ORAL | 30 days supply | Qty: 30 | Fill #3 | Status: AC

## 2021-02-13 ENCOUNTER — Other Ambulatory Visit: Payer: Self-pay

## 2021-02-14 ENCOUNTER — Telehealth: Payer: Self-pay | Admitting: Family Medicine

## 2021-02-15 ENCOUNTER — Other Ambulatory Visit: Payer: Self-pay

## 2021-02-15 ENCOUNTER — Ambulatory Visit: Payer: Self-pay | Attending: Physician Assistant | Admitting: Physician Assistant

## 2021-02-15 DIAGNOSIS — I10 Essential (primary) hypertension: Secondary | ICD-10-CM

## 2021-02-15 DIAGNOSIS — I214 Non-ST elevation (NSTEMI) myocardial infarction: Secondary | ICD-10-CM

## 2021-02-15 MED ORDER — ATORVASTATIN CALCIUM 40 MG PO TABS
ORAL_TABLET | ORAL | 1 refills | Status: DC
  Filled 2021-02-15: qty 90, fill #0
  Filled 2021-03-12 – 2021-03-29 (×2): qty 30, 30d supply, fill #0
  Filled 2021-05-02: qty 30, 30d supply, fill #1
  Filled 2021-05-26: qty 90, 90d supply, fill #2

## 2021-02-15 MED ORDER — CARVEDILOL 6.25 MG PO TABS
ORAL_TABLET | Freq: Two times a day (BID) | ORAL | 1 refills | Status: DC
Start: 2021-02-15 — End: 2021-09-12
  Filled 2021-02-15: qty 180, fill #0
  Filled 2021-03-12 – 2021-03-29 (×2): qty 60, 30d supply, fill #0
  Filled 2021-05-02: qty 60, 30d supply, fill #1
  Filled 2021-05-26: qty 180, 90d supply, fill #2

## 2021-02-15 NOTE — Progress Notes (Signed)
Patient ID: [name], female   DOB: 01/06/1966, 55 y.o.   MRN: [id number] Virtual Visit via Telephone Note  I connected with Lorella Nimrod on 02/15/21 at  1:50 PM EDT by telephone and verified that I am speaking with the correct person using two identifiers.  Location: Patient: home Provider: [facility] office   I discussed the limitations, risks, security and privacy concerns of performing an evaluation and management service by telephone and the availability of in person appointments. I also discussed with the patient that there may be a patient responsible charge related to this service. The patient expressed understanding and agreed to proceed.   History of Present Illness:  needs RF-wants 90 day.  No ssues or concerns.  Just had labs 1 month ago.    Observations/Objective:  NAD.  A&Ox3   Assessment and Plan: 1. Essential hypertension stable - carvedilol (COREG) 6.25 MG tablet; TAKE 1 TABLET (6.25 MG TOTAL) BY MOUTH 2 (TWO) TIMES DAILY WITH A MEAL.  Dispense: 180 tablet; Refill: 1  2. NSTEMI (non-ST elevated myocardial infarction) (HCC) - atorvastatin (LIPITOR) 40 MG tablet; TAKE 1 TABLET BY MOUTH DAILY AT 6PM.  Dispense: 90 tablet; Refill: 1    Follow Up Instructions: See PCP in 3-4 months   I discussed the assessment and treatment plan with the patient. The patient was provided an opportunity to ask questions and all were answered. The patient agreed with the plan and demonstrated an understanding of the instructions.   The patient was advised to call back or seek an in-person evaluation if the symptoms worsen or if the condition fails to improve as anticipated.  I provided 9 minutes of non-face-to-face time during this encounter.   Georgian Co, PA-C

## 2021-03-13 ENCOUNTER — Other Ambulatory Visit: Payer: Self-pay

## 2021-03-20 ENCOUNTER — Other Ambulatory Visit: Payer: Self-pay

## 2021-03-29 ENCOUNTER — Other Ambulatory Visit: Payer: Self-pay

## 2021-03-30 ENCOUNTER — Other Ambulatory Visit: Payer: Self-pay

## 2021-05-03 ENCOUNTER — Other Ambulatory Visit: Payer: Self-pay

## 2021-05-05 ENCOUNTER — Other Ambulatory Visit: Payer: Self-pay

## 2021-05-26 ENCOUNTER — Other Ambulatory Visit: Payer: Self-pay

## 2021-05-30 ENCOUNTER — Other Ambulatory Visit: Payer: Self-pay

## 2021-06-21 ENCOUNTER — Ambulatory Visit: Payer: Self-pay | Admitting: Family Medicine

## 2021-07-12 ENCOUNTER — Other Ambulatory Visit: Payer: Self-pay

## 2021-08-28 ENCOUNTER — Other Ambulatory Visit: Payer: Self-pay

## 2021-09-12 ENCOUNTER — Other Ambulatory Visit: Payer: Self-pay | Admitting: Family Medicine

## 2021-09-12 DIAGNOSIS — I10 Essential (primary) hypertension: Secondary | ICD-10-CM

## 2021-09-12 DIAGNOSIS — I214 Non-ST elevation (NSTEMI) myocardial infarction: Secondary | ICD-10-CM

## 2021-09-12 NOTE — Telephone Encounter (Signed)
Copied from CRM [id number]. Topic: Quick Communication - Rx Refill/Question ?>> Sep 12, 2021  9:26 AM Marylen Ponto wrote: ?Medication: atorvastatin (LIPITOR) 40 MG tablet and carvedilol (COREG) 6.25 MG tablet ? ?Has the patient contacted their pharmacy? Yes.  Pt told to contact provider ?(Agent: If no, request that the patient contact the pharmacy for the refill. If patient does not wish to contact the pharmacy document the reason why and proceed with request.) ?(Agent: If yes, when and what did the pharmacy advise?) ? ?Preferred Pharmacy (with phone number or street name): [facility] [address] [place], [place] - [address] Phone: [phone]  Fax: [phone] ? ?Has the patient been seen for an appointment in the last year OR does the patient have an upcoming appointment? Yes.   ? ?Agent: Please be advised that RX refills may take up to 3 business days. We ask that you follow-up with your pharmacy. ?

## 2021-09-13 MED ORDER — ATORVASTATIN CALCIUM 40 MG PO TABS: ORAL_TABLET | ORAL | 0 refills | Status: DC

## 2021-09-13 MED ORDER — CARVEDILOL 6.25 MG PO TABS: ORAL_TABLET | Freq: Two times a day (BID) | ORAL | 0 refills | Status: DC

## 2021-09-13 NOTE — Telephone Encounter (Signed)
A 90 day courtesy refill was given for the Coreg since pt has an appt coming up in May 2023. ? ?Requested Prescriptions  ?Pending Prescriptions Disp Refills  ?? atorvastatin (LIPITOR) 40 MG tablet 90 tablet 0  ?  Sig: TAKE 1 TABLET BY MOUTH DAILY AT 6PM.  ?  ? Cardiovascular:  Antilipid - Statins Failed - 09/12/2021 11:47 AM  ?  ?  Failed - Lipid Panel in normal range within the last 12 months  ?  Cholesterol, Total  ?Date Value Ref Range Status  ?01/12/2021 218 (H) 100 - 199 mg/dL Final  ? ?LDL Chol Calc (NIH)  ?Date Value Ref Range Status  ?01/12/2021 136 (H) 0 - 99 mg/dL Final  ? ?HDL  ?Date Value Ref Range Status  ?01/12/2021 56 >39 mg/dL Final  ? ?Triglycerides  ?Date Value Ref Range Status  ?01/12/2021 145 0 - 149 mg/dL Final  ? ?  ?  ?  Passed - Patient is not pregnant  ?  ?  Passed - Valid encounter within last 12 months  ?  Recent Outpatient Visits   ?      ? 7 months ago Essential hypertension  ? [facility] [place], [place], [place]  ? 8 months ago Screening cholesterol level  ? [facility] [place], [place], [place]  ? 1 year ago Left breast mass  ? [facility] Hoy Register, MD  ?  ?  ?Future Appointments   ?        ? In 2 months Hoy Register, MD [facility]  ?  ? ?  ?  ?  ?? carvedilol (COREG) 6.25 MG tablet 180 tablet 0  ?  Sig: TAKE 1 TABLET (6.25 MG TOTAL) BY MOUTH 2 (TWO) TIMES DAILY WITH A MEAL.  ?  ? Cardiovascular: Beta Blockers 3 Failed - 09/12/2021 11:47 AM  ?  ?  Failed - Last BP in normal range  ?  BP Readings from Last 1 Encounters:  ?07/21/20 (!) 164/81  ?   ?  ?  Failed - Valid encounter within last 6 months  ?  Recent Outpatient Visits   ?      ? 7 months ago Essential hypertension  ? [facility] [place], [place], [place]  ? 8 months ago Screening cholesterol level  ? [facility] [place], [place], [place]  ? 1 year  ago Left breast mass  ? [facility] Hoy Register, MD  ?  ?  ?Future Appointments   ?        ? In 2 months Hoy Register, MD [facility]  ?  ? ?  ?  ?  Passed - Cr in normal range and within 360 days  ?  Creatinine, Ser  ?Date Value Ref Range Status  ?01/12/2021 0.80 0.57 - 1.00 mg/dL Final  ?   ?  ?  Passed - AST in normal range and within 360 days  ?  AST  ?Date Value Ref Range Status  ?01/12/2021 30 0 - 40 IU/L Final  ?   ?  ?  Passed - ALT in normal range and within 360 days  ?  ALT  ?Date Value Ref Range Status  ?01/12/2021 24 0 - 32 IU/L Final  ?   ?  ?  Passed - Last Heart Rate  in normal range  ?  Pulse Readings from Last 1 Encounters:  ?07/21/20 70  ?   ?  ?  ? ?

## 2021-10-17 ENCOUNTER — Ambulatory Visit: Payer: Self-pay | Admitting: Family Medicine

## 2021-11-14 ENCOUNTER — Ambulatory Visit: Payer: Self-pay | Admitting: Family Medicine

## 2021-11-30 ENCOUNTER — Ambulatory Visit: Payer: Self-pay | Admitting: Physician Assistant

## 2021-12-04 ENCOUNTER — Other Ambulatory Visit: Payer: Self-pay

## 2021-12-14 ENCOUNTER — Other Ambulatory Visit: Payer: Self-pay

## 2022-01-03 ENCOUNTER — Ambulatory Visit: Payer: Self-pay | Admitting: Physician Assistant

## 2022-01-11 ENCOUNTER — Other Ambulatory Visit: Payer: Self-pay | Admitting: Family Medicine

## 2022-01-11 ENCOUNTER — Other Ambulatory Visit: Payer: Self-pay

## 2022-01-11 DIAGNOSIS — I10 Essential (primary) hypertension: Secondary | ICD-10-CM

## 2022-01-11 DIAGNOSIS — I214 Non-ST elevation (NSTEMI) myocardial infarction: Secondary | ICD-10-CM

## 2022-01-11 NOTE — Telephone Encounter (Signed)
Pt called and scheduled the next available appt with her PCP in November. Wants refills

## 2022-01-12 ENCOUNTER — Other Ambulatory Visit: Payer: Self-pay

## 2022-02-06 ENCOUNTER — Encounter: Payer: Self-pay | Admitting: Physician Assistant

## 2022-02-06 ENCOUNTER — Ambulatory Visit: Payer: Self-pay | Admitting: Physician Assistant

## 2022-02-06 ENCOUNTER — Other Ambulatory Visit: Payer: Self-pay

## 2022-02-06 VITALS — BP 174/92 | HR 72 | Resp 18 | Ht 66.0 in | Wt 177.0 lb

## 2022-02-06 DIAGNOSIS — I10 Essential (primary) hypertension: Secondary | ICD-10-CM | POA: Insufficient documentation

## 2022-02-06 DIAGNOSIS — I252 Old myocardial infarction: Secondary | ICD-10-CM

## 2022-02-06 DIAGNOSIS — R7303 Prediabetes: Secondary | ICD-10-CM

## 2022-02-06 DIAGNOSIS — R748 Abnormal levels of other serum enzymes: Secondary | ICD-10-CM

## 2022-02-06 DIAGNOSIS — E782 Mixed hyperlipidemia: Secondary | ICD-10-CM

## 2022-02-06 DIAGNOSIS — I160 Hypertensive urgency: Secondary | ICD-10-CM

## 2022-02-06 LAB — POCT GLYCOSYLATED HEMOGLOBIN (HGB A1C): Hemoglobin A1C: 5.9 % — AB (ref 4.0–5.6)

## 2022-02-06 MED ORDER — ATORVASTATIN CALCIUM 40 MG PO TABS
ORAL_TABLET | ORAL | 0 refills | Status: DC
  Filled 2022-02-06: qty 90, 90d supply, fill #0

## 2022-02-06 MED ORDER — CARVEDILOL 6.25 MG PO TABS
ORAL_TABLET | Freq: Two times a day (BID) | ORAL | 0 refills | Status: DC
  Filled 2022-02-06: qty 180, 90d supply, fill #0

## 2022-02-06 MED ORDER — CLONIDINE HCL 0.1 MG PO TABS
0.1000 mg | ORAL_TABLET | Freq: Once | ORAL | Status: AC
  Administered 2022-02-06: 0.1 mg via ORAL

## 2022-02-06 NOTE — Patient Instructions (Signed)
I encourage you to check your blood pressure at home, keep a written log and have available for all office visits.  If you continue having elevated blood pressure readings despite taking your carvedilol twice a day, please feel free to return to the mobile unit prior to your office visit with Dr. Alvis Lemmings on November 1.  Your A1c is 5.9, this is considered prediabetes, and this is higher than it was 1 year ago.  I strongly encourage you to follow a low sugar diet.  We will call you with today's lab results.  Roney Jaffe, PA-C Physician Assistant [facility] Medicine [email or web address]   Prediabetes Prediabetes is when your blood sugar (blood glucose) level is higher than normal but not high enough for you to be diagnosed with type 2 diabetes. Having prediabetes puts you at risk for developing type 2 diabetes (type 2 diabetes mellitus). With certain lifestyle changes, you may be able to prevent or delay the onset of type 2 diabetes. This is important because type 2 diabetes can lead to serious complications, such as: Heart disease. Stroke. Blindness. Kidney disease. Depression. Poor circulation in the feet and legs. In severe cases, this could lead to surgical removal of a leg (amputation). What are the causes? The exact cause of prediabetes is not known. It may result from insulin resistance. Insulin resistance develops when cells in the body do not respond properly to insulin that the body makes. This can cause excess glucose to build up in the blood. High blood glucose (hyperglycemia) can develop. What increases the risk? The following factors may make you more likely to develop this condition: You have a family member with type 2 diabetes. You are older than 45 years. You had a temporary form of diabetes during a pregnancy (gestational diabetes). You had polycystic ovary syndrome (PCOS). You are overweight or obese. You are inactive  (sedentary). You have a history of heart disease, including problems with cholesterol levels, high levels of blood fats, or high blood pressure. What are the signs or symptoms? You may have no symptoms. If you do have symptoms, they may include: Increased hunger. Increased thirst. Increased urination. Vision changes, such as blurry vision. Tiredness (fatigue). How is this diagnosed? This condition can be diagnosed with blood tests. Your blood glucose may be checked with one or more of the following tests: A fasting blood glucose (FBG) test. You will not be allowed to eat (you will fast) for at least 8 hours before a blood sample is taken. An A1C blood test (hemoglobin A1C). This test provides information about blood glucose levels over the previous 2?3 months. An oral glucose tolerance test (OGTT). This test measures your blood glucose at two points in time: After fasting. This is your baseline level. Two hours after you drink a beverage that contains glucose. You may be diagnosed with prediabetes if: Your FBG is 100?125 mg/dL (4.0-0.8 mmol/L). Your A1C level is 5.7?6.4% (39-46 mmol/mol). Your OGTT result is 140?199 mg/dL (6.7-61 mmol/L). These blood tests may be repeated to confirm your diagnosis. How is this treated? Treatment may include dietary and lifestyle changes to help lower your blood glucose and prevent type 2 diabetes from developing. In some cases, medicine may be prescribed to help lower the risk of type 2 diabetes. Follow these instructions at home: Nutrition  Follow a healthy meal plan. This includes eating lean proteins, whole grains, legumes, fresh fruits and vegetables, low-fat dairy products, and healthy fats. Follow instructions from your health care provider about  eating or drinking restrictions. Meet with a dietitian to create a healthy eating plan that is right for you. Lifestyle Do moderate-intensity exercise for at least 30 minutes a day on 5 or more days each  week, or as told by your health care provider. A mix of activities may be best, such as: Brisk walking, swimming, biking, and weight lifting. Lose weight as told by your health care provider. Losing 5-7% of your body weight can reverse insulin resistance. Do not drink alcohol if: Your health care provider tells you not to drink. You are pregnant, may be pregnant, or are planning to become pregnant. If you drink alcohol: Limit how much you use to: 0-1 drink a day for women. 0-2 drinks a day for men. Be aware of how much alcohol is in your drink. In the [place] one drink equals one 12 oz bottle of beer (355 mL), one 5 oz glass of wine (148 mL), or one 1 oz glass of hard liquor (44 mL). General instructions Take over-the-counter and prescription medicines only as told by your health care provider. You may be prescribed medicines that help lower the risk of type 2 diabetes. Do not use any products that contain nicotine or tobacco, such as cigarettes, e-cigarettes, and chewing tobacco. If you need help quitting, ask your health care provider. Keep all follow-up visits. This is important. Where to find more information American Diabetes Association: www.diabetes.org Academy of Nutrition and Dietetics: www.eatright.org American Heart Association: www.heart.org Contact a health care provider if: You have any of these symptoms: Increased hunger. Increased urination. Increased thirst. Fatigue. Vision changes, such as blurry vision. Get help right away if you: Have shortness of breath. Feel confused. Vomit or feel like you may vomit. Summary Prediabetes is when your blood sugar (blood glucose)level is higher than normal but not high enough for you to be diagnosed with type 2 diabetes. Having prediabetes puts you at risk for developing type 2 diabetes (type 2 diabetes mellitus). Make lifestyle changes such as eating a healthy diet and exercising regularly to help prevent diabetes. Lose weight as  told by your health care provider. This information is not intended to replace advice given to you by your health care provider. Make sure you discuss any questions you have with your health care provider. Document Revised: 09/10/2019 Document Reviewed: 09/10/2019 Elsevier Patient Education  2023 [employer].

## 2022-02-06 NOTE — Progress Notes (Signed)
Established Patient Office Visit  Subjective   Patient ID: [name], female    DOB: 04-16-1966  Age: 56 y.o. MRN: [id number]  Chief Complaint  Patient presents with   Medication Refill    States that she has been running low on her blood pressure medication, does endorse that she has been "stretching it out" and only taking it once a day.  States this has been the way for the last few weeks.  States that she does not check her blood pressure at home, denies any hypertensive symptoms.  States that she has been unable to make her medical appointments due to having to take care of her husband who had a stroke.    Past Medical History:  Diagnosis Date   Anemia    Hypertension    Hypocalcemia    NSTEMI (non-ST elevated myocardial infarction) [facility])    Social History   Socioeconomic History   Marital status: Single    Spouse name: Not on file   Number of children: Not on file   Years of education: Not on file   Highest education level: Not on file  Occupational History   Not on file  Tobacco Use   Smoking status: Never   Smokeless tobacco: Current    Types: Chew  Substance and Sexual Activity   Alcohol use: Not Currently   Drug use: Not on file   Sexual activity: Not on file  Other Topics Concern   Not on file  Social History Narrative   Not on file   Social Determinants of Health   Financial Resource Strain: Not on file  Food Insecurity: Not on file  Transportation Needs: Not on file  Physical Activity: Not on file  Stress: Not on file  Social Connections: Not on file  Intimate Partner Violence: Not on file   Family History  Problem Relation Age of Onset   Hypertension Father    Allergies  Allergen Reactions   Pork-Derived Products     Review of Systems  Constitutional: Negative.   HENT: Negative.    Eyes: Negative.   Respiratory:  Negative for shortness of breath.   Cardiovascular:  Negative for chest pain, palpitations and leg swelling.   Gastrointestinal: Negative.   Genitourinary: Negative.   Musculoskeletal: Negative.   Skin: Negative.   Neurological:  Negative for dizziness, weakness and headaches.  Endo/Heme/Allergies: Negative.   Psychiatric/Behavioral: Negative.        Objective:     BP (!) 187/90 (BP Location: Left Arm, Patient Position: Sitting, Cuff Size: Normal)   Pulse 72   Resp 18   Ht 5\' 6"  (1.676 m)   Wt 177 lb (80.3 kg)   SpO2 97%   BMI 28.57 kg/m  BP Readings from Last 3 Encounters:  02/06/22 (!) 187/90  07/21/20 (!) 164/81  07/09/18 129/79      Physical Exam Vitals and nursing note reviewed.  Constitutional:      Appearance: Normal appearance.  HENT:     Head: Normocephalic and atraumatic.     Right Ear: External ear normal.     Left Ear: External ear normal.     Nose: Nose normal.     Mouth/Throat:     Mouth: Mucous membranes are moist.     Pharynx: Oropharynx is clear.  Eyes:     Extraocular Movements: Extraocular movements intact.     Conjunctiva/sclera: Conjunctivae normal.     Pupils: Pupils are equal, round, and reactive to light.  Cardiovascular:  Rate and Rhythm: Normal rate and regular rhythm.     Pulses: Normal pulses.     Heart sounds: Normal heart sounds.  Pulmonary:     Effort: Pulmonary effort is normal.     Breath sounds: Normal breath sounds.  Musculoskeletal:        General: Normal range of motion.     Cervical back: Normal range of motion and neck supple.  Skin:    General: Skin is warm and dry.  Neurological:     General: No focal deficit present.     Mental Status: She is alert and oriented to person, place, and time.  Psychiatric:        Mood and Affect: Mood normal.        Behavior: Behavior normal.        Thought Content: Thought content normal.        Judgment: Judgment normal.        Assessment & Plan:   Problem List Items Addressed This Visit       Cardiovascular and Mediastinum   NSTEMI (non-ST elevated myocardial infarction)  ([place])   Relevant Medications   atorvastatin (LIPITOR) 40 MG tablet   carvedilol (COREG) 6.25 MG tablet     Other   Hyperlipidemia   Relevant Medications   atorvastatin (LIPITOR) 40 MG tablet   carvedilol (COREG) 6.25 MG tablet   Other Relevant Orders   Lipid panel   Other Visit Diagnoses     Essential hypertension    -  Primary   Relevant Medications   atorvastatin (LIPITOR) 40 MG tablet   carvedilol (COREG) 6.25 MG tablet   cloNIDine (CATAPRES) tablet 0.1 mg (Completed) (Start on 02/06/2022 12:15 PM)   Other Relevant Orders   CBC with Differential/Platelet   Comp. Metabolic Panel (12)   TSH   History of non-ST elevation myocardial infarction (NSTEMI)       Prediabetes       Relevant Orders   POCT glycosylated hemoglobin (Hb A1C) (Completed)   Elevated liver enzymes       Hypertensive urgency       Relevant Medications   atorvastatin (LIPITOR) 40 MG tablet   carvedilol (COREG) 6.25 MG tablet   cloNIDine (CATAPRES) tablet 0.1 mg (Completed) (Start on 02/06/2022 12:15 PM)      1. Essential hypertension Patient given dose of clonidine in clinic.  Patient stated she still had 4 tablets of carvedilol at home, did encourage patient to resume dosing, states that she will pick up a refill tomorrow.  Does have to use bus for transportation.  Patient encouraged to check blood pressure at home, keep a written log and have available for all office visits.  Patient encouraged to return to mobile unit prior to office visit with primary care provider on November 1 if her blood pressure readings remain elevated.  Patient understands and agrees.  Red flags given for prompt reevaluation.  Fasting labs completed today - CBC with Differential/Platelet - Comp. Metabolic Panel (12) - TSH - carvedilol (COREG) 6.25 MG tablet; TAKE 1 TABLET (6.25 MG TOTAL) BY MOUTH 2 (TWO) TIMES DAILY WITH A MEAL.  Dispense: 180 tablet; Refill: 0  2. Hypertensive urgency  - cloNIDine (CATAPRES) tablet 0.1  mg  3. History of non-ST elevation myocardial infarction (NSTEMI) Continue current regimen - atorvastatin (LIPITOR) 40 MG tablet; TAKE 1 TABLET BY MOUTH DAILY AT 6PM.  Dispense: 90 tablet; Refill: 0  4. Mixed hyperlipidemia  - Lipid panel - atorvastatin (LIPITOR) 40 MG tablet;  TAKE 1 TABLET BY MOUTH DAILY AT 6PM.  Dispense: 90 tablet; Refill: 0  5. Prediabetes A1c 5.9.  Patient education given on low sugar diet - POCT glycosylated hemoglobin (Hb A1C)  6. Elevated liver enzymes    I have reviewed the patient's medical history (PMH, PSH, Social History, Family History, Medications, and allergies) , and have been updated if relevant. I spent 30 minutes reviewing chart and  face to face time with patient.      Return in about 12 weeks (around 05/01/2022) for with Dr Alvis Lemmings .    Kasandra Knudsen Mayers, PA-C

## 2022-02-07 LAB — LIPID PANEL
Chol/HDL Ratio: 4.2 ratio (ref 0.0–4.4)
Cholesterol, Total: 272 mg/dL — ABNORMAL HIGH (ref 100–199)
HDL: 65 mg/dL (ref >39)
LDL Chol Calc (NIH): 184 mg/dL — ABNORMAL HIGH (ref 0–99)
Triglycerides: 127 mg/dL (ref 0–149)
VLDL Cholesterol Cal: 23 mg/dL (ref 5–40)

## 2022-02-07 LAB — CBC WITH DIFFERENTIAL/PLATELET
Basophils Absolute: 0 10*3/uL (ref 0.0–0.2)
Basos: 0 %
EOS (ABSOLUTE): 0.1 10*3/uL (ref 0.0–0.4)
Eos: 3 %
Hematocrit: 42 % (ref 34.0–46.6)
Hemoglobin: 14 g/dL (ref 11.1–15.9)
Immature Grans (Abs): 0 10*3/uL (ref 0.0–0.1)
Immature Granulocytes: 0 %
Lymphocytes Absolute: 1.7 10*3/uL (ref 0.7–3.1)
Lymphs: 39 %
MCH: 30.8 pg (ref 26.6–33.0)
MCHC: 33.3 g/dL (ref 31.5–35.7)
MCV: 92 fL (ref 79–97)
Monocytes Absolute: 0.3 10*3/uL (ref 0.1–0.9)
Monocytes: 6 %
Neutrophils Absolute: 2.2 10*3/uL (ref 1.4–7.0)
Neutrophils: 52 %
Platelets: 234 10*3/uL (ref 150–450)
RBC: 4.55 x10E6/uL (ref 3.77–5.28)
RDW: 13.4 % (ref 11.7–15.4)
WBC: 4.3 10*3/uL (ref 3.4–10.8)

## 2022-02-07 LAB — COMP. METABOLIC PANEL (12)
AST: 42 IU/L — ABNORMAL HIGH (ref 0–40)
Albumin/Globulin Ratio: 0.9 — ABNORMAL LOW (ref 1.2–2.2)
Albumin: 3.9 g/dL (ref 3.8–4.9)
Alkaline Phosphatase: 161 IU/L — ABNORMAL HIGH (ref 44–121)
BUN/Creatinine Ratio: 20 (ref 9–23)
BUN: 16 mg/dL (ref 6–24)
Bilirubin Total: 0.3 mg/dL (ref 0.0–1.2)
Calcium: 9.1 mg/dL (ref 8.7–10.2)
Chloride: 104 mmol/L (ref 96–106)
Creatinine, Ser: 0.8 mg/dL (ref 0.57–1.00)
Globulin, Total: 4.4 g/dL (ref 1.5–4.5)
Glucose: 90 mg/dL (ref 70–99)
Potassium: 4.2 mmol/L (ref 3.5–5.2)
Sodium: 138 mmol/L (ref 134–144)
Total Protein: 8.3 g/dL (ref 6.0–8.5)
eGFR: 88 mL/min/{1.73_m2} (ref >59)

## 2022-02-07 LAB — TSH: TSH: 0.99 u[IU]/mL (ref 0.450–4.500)

## 2022-02-07 NOTE — Addendum Note (Signed)
Addended by: Roney Jaffe on: 02/07/2022 01:53 PM   Modules accepted: Orders

## 2022-02-22 ENCOUNTER — Ambulatory Visit: Payer: Medicaid Other | Admitting: Physician Assistant

## 2022-05-01 ENCOUNTER — Ambulatory Visit: Payer: Medicaid Other | Admitting: Family Medicine

## 2022-05-30 ENCOUNTER — Other Ambulatory Visit: Payer: Self-pay

## 2022-05-31 ENCOUNTER — Other Ambulatory Visit: Payer: Self-pay

## 2022-05-31 ENCOUNTER — Other Ambulatory Visit: Payer: Self-pay | Admitting: Physician Assistant

## 2022-05-31 DIAGNOSIS — E782 Mixed hyperlipidemia: Secondary | ICD-10-CM

## 2022-05-31 DIAGNOSIS — I10 Essential (primary) hypertension: Secondary | ICD-10-CM

## 2022-05-31 DIAGNOSIS — I252 Old myocardial infarction: Secondary | ICD-10-CM

## 2022-06-01 ENCOUNTER — Other Ambulatory Visit: Payer: Self-pay

## 2022-06-01 MED ORDER — ATORVASTATIN CALCIUM 40 MG PO TABS
40.0000 mg | ORAL_TABLET | Freq: Every day | ORAL | 0 refills | Status: AC
  Filled 2022-06-01 – 2022-06-08 (×2): qty 90, 90d supply, fill #0

## 2022-06-01 MED ORDER — CARVEDILOL 6.25 MG PO TABS
6.2500 mg | ORAL_TABLET | Freq: Two times a day (BID) | ORAL | 0 refills | Status: AC
  Filled 2022-06-01 – 2022-06-08 (×2): qty 180, 90d supply, fill #0

## 2022-06-07 ENCOUNTER — Other Ambulatory Visit: Payer: Self-pay

## 2022-06-08 ENCOUNTER — Other Ambulatory Visit: Payer: Self-pay

## 2022-09-13 ENCOUNTER — Other Ambulatory Visit: Payer: Self-pay

## 2022-09-17 ENCOUNTER — Other Ambulatory Visit: Payer: Self-pay

## 2022-09-21 ENCOUNTER — Other Ambulatory Visit: Payer: Self-pay | Admitting: Family Medicine

## 2022-09-21 ENCOUNTER — Other Ambulatory Visit (HOSPITAL_COMMUNITY): Payer: Self-pay

## 2022-09-21 ENCOUNTER — Other Ambulatory Visit: Payer: Self-pay

## 2022-09-21 DIAGNOSIS — I252 Old myocardial infarction: Secondary | ICD-10-CM

## 2022-09-21 DIAGNOSIS — E782 Mixed hyperlipidemia: Secondary | ICD-10-CM

## 2022-09-21 NOTE — Telephone Encounter (Signed)
Pt needs appt

## 2022-09-21 NOTE — Telephone Encounter (Signed)
Unable to refill per protocol, appointment needed.  Called patient to schedule OV, no answer or VM setup.  Requested Prescriptions  Pending Prescriptions Disp Refills   atorvastatin (LIPITOR) 40 MG tablet 90 tablet 0    Sig: Take 1 tablet (40 mg total) by mouth daily at 6 PM.     Cardiovascular:  Antilipid - Statins Failed - 09/21/2022  9:38 AM      Failed - Valid encounter within last 12 months    Recent Outpatient Visits           1 year ago Essential hypertension   [place], [place]   1 year ago Screening cholesterol level   [facility] [place], [place], [place]   2 years ago Left breast mass   [place], Charlane Ferretti, MD              Failed - Lipid Panel in normal range within the last 12 months    Cholesterol, Total  Date Value Ref Range Status  02/06/2022 272 (H) 100 - 199 mg/dL Final   LDL Chol Calc (NIH)  Date Value Ref Range Status  02/06/2022 184 (H) 0 - 99 mg/dL Final   HDL  Date Value Ref Range Status  02/06/2022 65 >39 mg/dL Final   Triglycerides  Date Value Ref Range Status  02/06/2022 127 0 - 149 mg/dL Final         Passed - Patient is not pregnant

## 2022-09-26 ENCOUNTER — Other Ambulatory Visit: Payer: Self-pay

## 2022-10-05 ENCOUNTER — Other Ambulatory Visit: Payer: Self-pay

## 2023-01-23 ENCOUNTER — Other Ambulatory Visit: Payer: Self-pay | Admitting: Internal Medicine

## 2023-01-23 DIAGNOSIS — Z1231 Encounter for screening mammogram for malignant neoplasm of breast: Secondary | ICD-10-CM
